# Patient Record
Sex: Male | Born: 1950 | Race: White | Hispanic: No | Marital: Married | Smoking: Never smoker
Health system: Southern US, Community
[De-identification: ages and names within clinical notes are randomized; demographics above are authoritative.]

## PROBLEM LIST (undated history)

## (undated) DIAGNOSIS — R011 Cardiac murmur, unspecified: Secondary | ICD-10-CM

## (undated) DIAGNOSIS — G4730 Sleep apnea, unspecified: Secondary | ICD-10-CM

## (undated) DIAGNOSIS — R0683 Snoring: Secondary | ICD-10-CM

## (undated) DIAGNOSIS — I7121 Aneurysm of the ascending aorta, without rupture: Secondary | ICD-10-CM

## (undated) DIAGNOSIS — E079 Disorder of thyroid, unspecified: Secondary | ICD-10-CM

## (undated) DIAGNOSIS — R001 Bradycardia, unspecified: Secondary | ICD-10-CM

## (undated) DIAGNOSIS — I10 Essential (primary) hypertension: Secondary | ICD-10-CM

## (undated) DIAGNOSIS — K219 Gastro-esophageal reflux disease without esophagitis: Secondary | ICD-10-CM

## (undated) DIAGNOSIS — I471 Supraventricular tachycardia, unspecified: Secondary | ICD-10-CM

## (undated) DIAGNOSIS — E039 Hypothyroidism, unspecified: Secondary | ICD-10-CM

## (undated) DIAGNOSIS — R931 Abnormal findings on diagnostic imaging of heart and coronary circulation: Secondary | ICD-10-CM

## (undated) DIAGNOSIS — M4800 Spinal stenosis, site unspecified: Secondary | ICD-10-CM

## (undated) DIAGNOSIS — R351 Nocturia: Secondary | ICD-10-CM

## (undated) DIAGNOSIS — N6310 Unspecified lump in the right breast, unspecified quadrant: Secondary | ICD-10-CM

## (undated) DIAGNOSIS — I444 Left anterior fascicular block: Secondary | ICD-10-CM

## (undated) HISTORY — DX: Essential (primary) hypertension: I10

## (undated) HISTORY — PX: BACK SURGERY: SHX140

## (undated) HISTORY — PX: CARPAL TUNNEL RELEASE: SHX101

## (undated) HISTORY — DX: Supraventricular tachycardia: I47.1

## (undated) HISTORY — DX: Aneurysm of the ascending aorta, without rupture: I71.21

## (undated) HISTORY — DX: Left anterior fascicular block: I44.4

## (undated) HISTORY — PX: TONSILLECTOMY: SUR1361

## (undated) HISTORY — DX: Gastro-esophageal reflux disease without esophagitis: K21.9

## (undated) HISTORY — DX: Abnormal findings on diagnostic imaging of heart and coronary circulation: R93.1

## (undated) HISTORY — DX: Supraventricular tachycardia, unspecified: I47.10

## (undated) HISTORY — PX: LASIK: SHX215

## (undated) HISTORY — DX: Disorder of thyroid, unspecified: E07.9

## (undated) HISTORY — DX: Cardiac murmur, unspecified: R01.1

## (undated) HISTORY — PX: UVULECTOMY: SHX2631

## (undated) HISTORY — PX: OTHER SURGICAL HISTORY: SHX169

## (undated) HISTORY — DX: Nocturia: R35.1

## (undated) HISTORY — PX: SEPTOPLASTY: SUR1290

## (undated) HISTORY — DX: Bradycardia, unspecified: R00.1

## (undated) HISTORY — DX: Spinal stenosis, site unspecified: M48.00

---

## 2022-02-19 ENCOUNTER — Encounter (INDEPENDENT_AMBULATORY_CARE_PROVIDER_SITE_OTHER): Payer: Self-pay

## 2022-02-19 ENCOUNTER — Telehealth: Payer: Self-pay | Admitting: *Deleted

## 2022-02-19 ENCOUNTER — Ambulatory Visit (INDEPENDENT_AMBULATORY_CARE_PROVIDER_SITE_OTHER): Payer: Medicare Other | Admitting: Cardiology

## 2022-02-19 ENCOUNTER — Other Ambulatory Visit: Payer: Self-pay

## 2022-02-19 VITALS — BP 134/78 | HR 77 | Ht 68.0 in | Wt 249.0 lb

## 2022-02-19 DIAGNOSIS — I471 Supraventricular tachycardia: Secondary | ICD-10-CM

## 2022-02-19 DIAGNOSIS — I77810 Thoracic aortic ectasia: Secondary | ICD-10-CM | POA: Diagnosis not present

## 2022-02-19 DIAGNOSIS — R001 Bradycardia, unspecified: Secondary | ICD-10-CM | POA: Diagnosis not present

## 2022-02-19 DIAGNOSIS — I10 Essential (primary) hypertension: Secondary | ICD-10-CM

## 2022-02-19 DIAGNOSIS — I444 Left anterior fascicular block: Secondary | ICD-10-CM | POA: Diagnosis not present

## 2022-02-19 NOTE — Patient Instructions (Addendum)
Medication Instructions:  ?Your physician recommends that you continue on your current medications as directed. Please refer to the Current Medication list given to you today. ? ?*If you need a refill on your cardiac medications before your next appointment, please call your pharmacy* ? ? ?Testing/Procedures: ?Your physician has requested that you have a calcium score CT scan. There is a $99 fee for the scan.  ? ? ?Your physician has recommended that you have a sleep study. This test records several body functions during sleep, including: brain activity, eye movement, oxygen and carbon dioxide blood levels, heart rate and rhythm, breathing rate and rhythm, the flow of air through your mouth and nose, snoring, body muscle movements, and chest and belly movement. ? ? ?Follow-Up: ?At [facility], you and your health needs are our priority.  As part of our continuing mission to provide you with exceptional heart care, we have created designated Provider Care Teams.  These Care Teams include your primary Cardiologist (physician) and Advanced Practice Providers (APPs -  Physician Assistants and Nurse Practitioners) who all work together to provide you with the care you need, when you need it. ? ? ?Your next appointment:   ?3 month(s) ? ?The format for your next appointment:   ?In Person ? ?Provider:   ?Fransico Him, MD ?

## 2022-02-19 NOTE — Addendum Note (Signed)
Addended by: Antonieta Iba on: 02/19/2022 01:59 PM ? ? Modules accepted: Orders ? ?

## 2022-02-19 NOTE — Progress Notes (Signed)
? ?Cardiology CONSULT Note   ? ?Date:  02/19/2022  ? ?ID:  Gregory Simpson, DOB [birth year], MRN [id number] ? ?PCP:  Clemmie Krill, FNP  ?Cardiologist:  Fransico Him, MD  ? ?Chief Complaint  ?Patient presents with  ? New Patient (Initial Visit)  ?  SVT, bradycardia and LAFB  ? ? ?History of Present Illness:  ?Gregory Simpson is a 71 y.o. male who is being seen today for the evaluation of asymptomatic left anterior fascicular block and a history of bradycardia at the request of Clemmie Krill, FNP. ? ?This is a 71 year old male with a history of GERD, hypertension, remote history of SVT.  He recently was seen by his primary physician recently and complained that he was having palpitations and felt like his current Cardiologist was not helping him.  He wore a Ziopatch which showed SVT, NCVT and tachy brady syndrome. 2D echo done 12/31/2021 showed normal LVF with EF 60-65% with mil AR.  He has a hx of bradycardia that has been a chronic problem.  EKG in Sept 22 showed sinus bradycardia with HR 46bpm and recently 48bpm. He has a hx of dilated ascending aorta at 4.3cm on Echo 12/31/2021.  Review of recent Ziopatch showed SVT as high as 160bpm.  There was no NSVT noted. Average heart rate was 59bpm and ranged from 37bpm while sleeping to 114bpm, occasional PVCs and atrial triplets.  ? ?He denies any chest pain or pressure,  PND, orthopnea, LE edema, dizziness (except when working on his back and gets too fast) or syncope. He has mild DOE related to obesity.  He is compliant with his meds and is tolerating meds with no SE. His wife says that his bradycardia is mainly at night.  He says that he will wake up breathing hard and his heart rate will be fast.  He has a hx of sleep study over 12 years ago and it was mild and was not placed on CPAP.  He denies any palpitations during the day.   ? ?Past Medical History:  ?Diagnosis Date  ? GERD (gastroesophageal reflux disease)   ? HTN (hypertension)   ? Left anterior hemiblock   ?  Murmur   ? Nocturia   ? Sinus bradycardia   ? Spinal stenosis   ? SVT (supraventricular tachycardia) ([place])   ? Thyroid disease   ? ? ?Past Surgical History:  ?Procedure Laterality Date  ? CARPAL TUNNEL RELEASE    ? INSERTION OF INTERSPINOUS/INTERLAMINAR PROCESS STABILIZATION/DISTRACTION DEVICE    ? LASIK Bilateral   ? SEPTOPLASTY    ? TONSILLECTOMY    ? UVULECTOMY    ? ? ?Current Medications: ?Current Meds  ?Medication Sig  ? amLODipine (NORVASC) 5 MG tablet Take 10 mg by mouth daily.  ? esomeprazole (NEXIUM) 20 MG packet Take 20 mg by mouth daily before breakfast.  ? hydrALAZINE (APRESOLINE) 25 MG tablet Take 25 mg by mouth 2 (two) times daily.  ? levothyroxine (SYNTHROID) 88 MCG tablet Take 88 mcg by mouth daily before breakfast.  ? losartan (COZAAR) 100 MG tablet Take 100 mg by mouth daily.  ? ? ?Allergies:   Patient has no known allergies.  ? ?Social History  ? ?Socioeconomic History  ? Marital status: Married  ?  Spouse name: Not on file  ? Number of children: Not on file  ? Years of education: Not on file  ? Highest education level: Not on file  ?Occupational History  ? Not on file  ?Tobacco Use  ?  Smoking status: Never  ? Smokeless tobacco: Never  ?Substance and Sexual Activity  ? Alcohol use: Yes  ?  Alcohol/week: 1.0 standard drink  ?  Types: 1 Shots of liquor per week  ? Drug use: Never  ? Sexual activity: Not on file  ?Other Topics Concern  ? Not on file  ?Social History Narrative  ? Not on file  ? ?Social Determinants of Health  ? ?Financial Resource Strain: Not on file  ?Food Insecurity: Not on file  ?Transportation Needs: Not on file  ?Physical Activity: Not on file  ?Stress: Not on file  ?Social Connections: Not on file  ?  ? ?Family History:  The patient's family history includes Dementia in his mother; Stroke in his father.  ? ?ROS:   ?Please see the history of present illness.    ?ROS All other systems reviewed and are negative. ? ?   ? View : No data to display.  ?  ?  ?  ? ? ? ? ? ?PHYSICAL EXAM:    ?VS:  BP 134/78   Pulse 77   Ht '5\' 8"'$  (1.727 m)   Wt 249 lb (112.9 kg)   SpO2 97%   BMI 37.86 kg/m?    ?GEN: Well nourished, well developed, in no acute distress  ?HEENT: normal  ?Neck: no JVD, carotid bruits, or masses ?Cardiac: RRR; no murmurs, rubs, or gallops,no edema.  Intact distal pulses bilaterally.  ?Respiratory:  clear to auscultation bilaterally, normal work of breathing ?GI: soft, nontender, nondistended, + BS ?MS: no deformity or atrophy  ?Skin: warm and dry, no rash ?Neuro:  Alert and Oriented x 3, Strength and sensation are intact ?Psych: euthymic mood, full affect ? ?Wt Readings from Last 3 Encounters:  ?02/19/22 249 lb (112.9 kg)  ?  ? ? ?Studies/Labs Reviewed:  ? ?EKG:  EKG is ordered today.  The ekg ordered today demonstrates NSR and LAFB ? ?Recent Labs: ?No results found for requested labs within last 8760 hours.  ? ?Lipid Panel ?No results found for: CHOL, TRIG, HDL, CHOLHDL, VLDL, LDLCALC, LDLDIRECT ? ?Additional studies/ records that were reviewed today include:  ?2D echo and Ziopatch ? ? ? ?ASSESSMENT:   ? ?1. SVT (supraventricular tachycardia) ([place])   ?2. LAFB (left anterior fascicular block)   ?3. Bradycardia   ?4. Ascending aorta dilatation ([facility])   ?5. Primary hypertension   ? ? ? ?PLAN:  ?In order of problems listed above: ? ?SVT ?-he is completely asymptomatic during the day ?-he has episodes of tachycardia occur at night and wake him up at night  ?-I suspect that his SVT is being triggered by OSA as we also wakes up gasping for breath ?-since he is not symptomatic during the day I do not think we need to treat this ?-get home sleep study to assess for OSA ? ?2.  Asymptomatic bradycardia ?-HRs have been 46-50bpm for several years ?-recent Ziopatch showed average HR 59bpm ? ?3.  LAFB ?-2D echo 12/2021 showed normal LVF  ? ?4.  Mildly dilated ascending aorta ?-50m by echo 12/2021 ? ?5.  HTN ?-BP controlled on exam today ?-continue prescription drug management with Hydralazine '25mg'$   BID, amlodipine '5mg'$  daily and Losartan '100mg'$  daily.  ? ?6.  Cardiac risk factors ?-he has HTN ?-recommend coronary Ca score to assess future cardiac risk ? ?Time Spent: ?20 minutes total time of encounter, including 15 minutes spent in face-to-face patient care on the date of this encounter. This time includes coordination of care  and counseling regarding above mentioned problem list. Remainder of non-face-to-face time involved reviewing chart documents/testing relevant to the patient encounter and documentation in the medical record. I have independently reviewed documentation from referring provider ? ?Medication Adjustments/Labs and Tests Ordered: ?Current medicines are reviewed at length with the patient today.  Concerns regarding medicines are outlined above.  Medication changes, Labs and Tests ordered today are listed in the Patient Instructions below. ? ?There are no Patient Instructions on file for this visit. ? ? ?Signed, ?Fransico Him, MD  ?02/19/2022 1:48 PM    ?[facility] ?[address], [place], [place]  [id number] ?Phone: [phone]; Fax: [phone]  ? ?

## 2022-02-19 NOTE — Telephone Encounter (Signed)
Set up date 02/19/22 ?

## 2022-02-19 NOTE — Telephone Encounter (Signed)
Pt was seen in the office today with Dr. Heron Nay who ordered an [facility] sleep study. I have gone over the instructions on how to use the device. Have uploaded the application onto the pt's phone. Pt is aware to not open the box until we call them with the PIN#.  ?

## 2022-02-19 NOTE — Addendum Note (Signed)
Addended by: Antonieta Iba on: 02/19/2022 02:04 PM ? ? Modules accepted: Orders ? ?

## 2022-02-22 ENCOUNTER — Encounter (INDEPENDENT_AMBULATORY_CARE_PROVIDER_SITE_OTHER): Payer: Medicare Other | Admitting: Cardiology

## 2022-02-22 DIAGNOSIS — G4733 Obstructive sleep apnea (adult) (pediatric): Secondary | ICD-10-CM | POA: Diagnosis not present

## 2022-02-22 NOTE — Telephone Encounter (Signed)
Called and made the patient aware that he may proceed with the [facility] Sleep Study. PIN # provided to the patient. Patient made aware that he will be contacted after the test has been read with the results and any recommendations. Patient verbalized understanding and thanked me for the call.  ? ? ?Pt will do sleep study this week  ?

## 2022-02-22 NOTE — Telephone Encounter (Signed)
NO PA REQUIRED

## 2022-02-25 ENCOUNTER — Ambulatory Visit: Payer: Medicare Other

## 2022-02-25 DIAGNOSIS — I471 Supraventricular tachycardia: Secondary | ICD-10-CM

## 2022-02-25 DIAGNOSIS — R001 Bradycardia, unspecified: Secondary | ICD-10-CM

## 2022-02-25 DIAGNOSIS — I10 Essential (primary) hypertension: Secondary | ICD-10-CM

## 2022-02-25 DIAGNOSIS — I444 Left anterior fascicular block: Secondary | ICD-10-CM

## 2022-02-25 DIAGNOSIS — I77810 Thoracic aortic ectasia: Secondary | ICD-10-CM

## 2022-02-25 NOTE — Procedures (Signed)
? ?  Sleep Study Report ?Patient Information ?Study Date: 02/22/22 ?Patient Name: Gregory Simpson ?Patient ID: [id number] ?Birth Date: 15-Apr-2051 ?Age: 71 ?Gender: Male ?BMI: 37.8 (W=249 lb, H=5' 8'') ?Neck Circ.: 18 '' ?Referring Physician: Fransico Him, MD ? ?TEST DESCRIPTION: Home sleep apnea testing was completed using the WatchPat, a Type 1 device, utilizing  ?peripheral arterial tonometry (PAT), chest movement, actigraphy, pulse oximetry, pulse rate, body position and snore.  ?AHI was calculated with apnea and hypopnea using valid sleep time as the denominator. RDI includes apneas,  ?hypopneas, and RERAs. The data acquired and the scoring of sleep and all associated events were performed in  ?accordance with the recommended standards and specifications as outlined in the AASM Manual for the Scoring of  ?Sleep and Associated Events 2.2.0 (2015). ? ?FINDINGS: ?1. Moderate Obstructive Sleep Apnea with AHI 25.8/hr.  ?2. No significant Central Sleep Apnea with pAHIc 4.4/hr. ?3. Oxygen desaturations as low as 78%. ?4. Moderate snoring was present. O2 sats were < 88% for 2.1 min. ?5. Total sleep time was 7 hrs and 5 min. ?6. 11.4% of total sleep time was spent in REM sleep.  ?7. Prolonged sleep onset latency at 29 min ?8. Shortened REM sleep onset latency at 25 min.  ?9. Total awakenings were 7.  ? ?DIAGNOSIS:  ?Moderate Obstructive Sleep Apnea (G47.33) ? ?RECOMMENDATIONS: ?1. Clinical correlation of these findings is necessary. The decision to treat obstructive sleep apnea (OSA) is usually  ?based on the presence of apnea symptoms or the presence of associated medical conditions such as Hypertension,  ?Congestive Heart Failure, Atrial Fibrillation or Obesity. The most common symptoms of OSA are snoring, gasping for  ?breath while sleeping, daytime sleepiness and fatigue.  ? ?2. Initiating apnea therapy is recommended given the presence of symptoms and/or associated conditions.  ?Recommend proceeding with one of the  following: ? ? a. Auto-CPAP therapy with a pressure range of 5-20cm H2O. ? ? b. An oral appliance (OA) that can be obtained from certain dentists with expertise in sleep medicine. These are  ?primarily of use in non-obese patients with mild and moderate disease. ? ? c. An ENT consultation which may be useful to look for specific causes of obstruction and possible treatment  ?options. ? ? d. If patient is intolerant to PAP therapy, consider referral to ENT for evaluation for hypoglossal nerve stimulator.  ? ?3. Close follow-up is necessary to ensure success with CPAP or oral appliance therapy for maximum benefit . ? ?4. A follow-up oximetry study on CPAP is recommended to assess the adequacy of therapy and determine the need  ?for supplemental oxygen or the potential need for Bi-level therapy. An arterial blood gas to determine the adequacy of  ?baseline ventilation and oxygenation should also be considered. ? ?5. Healthy sleep recommendations include: adequate nightly sleep (normal 7-9 hrs/night), avoidance of caffeine after  ?noon and alcohol near bedtime, and maintaining a sleep environment that is cool, dark and quiet. ? ?6. Weight loss for overweight patients is recommended. Even modest amounts of weight loss can significantly  ?improve the severity of sleep apnea. ? ?7. Snoring recommendations include: weight loss where appropriate, side sleeping, and avoidance of alcohol before  ?bed. ? ?8. Operation of motor vehicle should not be performed when sleepy. ? ?Signature: ?Electronically Signed: 02/25/22 ?Fransico Him, MD; [facility]; [place], [place] Board of  Sleep Medicine ? ?

## 2022-03-09 ENCOUNTER — Ambulatory Visit
Admission: RE | Admit: 2022-03-09 | Discharge: 2022-03-09 | Disposition: A | Discharge status: Home / Self Care | Payer: Self-pay | Source: Ambulatory Visit | Attending: Cardiology | Admitting: Cardiology

## 2022-03-09 ENCOUNTER — Encounter: Payer: Self-pay | Admitting: Cardiology

## 2022-03-09 DIAGNOSIS — I10 Essential (primary) hypertension: Secondary | ICD-10-CM

## 2022-03-09 DIAGNOSIS — I471 Supraventricular tachycardia: Secondary | ICD-10-CM

## 2022-03-09 DIAGNOSIS — R931 Abnormal findings on diagnostic imaging of heart and coronary circulation: Secondary | ICD-10-CM | POA: Insufficient documentation

## 2022-03-09 DIAGNOSIS — I77810 Thoracic aortic ectasia: Secondary | ICD-10-CM

## 2022-03-09 DIAGNOSIS — I444 Left anterior fascicular block: Secondary | ICD-10-CM

## 2022-03-09 DIAGNOSIS — R001 Bradycardia, unspecified: Secondary | ICD-10-CM

## 2022-03-10 ENCOUNTER — Telehealth: Payer: Self-pay | Admitting: *Deleted

## 2022-03-10 ENCOUNTER — Telehealth: Payer: Self-pay | Admitting: Cardiology

## 2022-03-10 DIAGNOSIS — J3489 Other specified disorders of nose and nasal sinuses: Secondary | ICD-10-CM

## 2022-03-10 DIAGNOSIS — I77810 Thoracic aortic ectasia: Secondary | ICD-10-CM

## 2022-03-10 NOTE — Telephone Encounter (Signed)
The patient has been notified of the result and verbalized understanding.  All questions (if any) were answered. ?Antonieta Iba, RN 03/10/2022 5:21 PM  ?Right breast ultrasound  and CTA have been ordered.  ?

## 2022-03-10 NOTE — Telephone Encounter (Signed)
-----   Message from Sueanne Margarita, MD sent at 03/10/2022  3:22 PM EDT ----- ?Non cardiac portion of coronary Ca score showed multiple enlarged lymph nodes in the chest and anterior right chest subcutaneous tissues of unclear etiology.  This can be assessed at time of Chest CTA with contrast - please do ASAP.  Also get a dedicated right breast US - will need to be done at breast center.  - please forward to PCP ?

## 2022-03-10 NOTE — Telephone Encounter (Signed)
The patient has been notified of the result and verbalized understanding.  All questions (if any) were answered. ?Gregory Simpson, [name] 03/10/2022 5:40 PM   ? ?Patient can not tolerate cpap because he can't have any air blowing up his right nostril because it causes him severe pain. Patient states when he lies down he gets fluid build up in his nostrils and he can't breathe so a cpap is not an option.  ?The patient says he communicated this to you in his office visit and you advised him there are other alternatives. ?

## 2022-03-10 NOTE — Telephone Encounter (Signed)
Patient's wife is returning call to discuss CT results. Please return call to [phone]. ?

## 2022-03-10 NOTE — Telephone Encounter (Signed)
-----   Message from Precious Gilding, RN sent at 03/10/2022  7:55 AM EDT ----- ? ?----- Message ----- ?From: Sueanne Margarita, MD ?Sent: 03/09/2022   4:39 PM EDT ?To: Cv Div Ch St Triage ? ?Patient has coronary calcium score 60 which is 33rd percentile for age, gender and race matched controls.  He also has a severe ascending aortic aneurysm measuring 55 mm on this noncontrasted study.  Please order a gated chest CTA with contrast for further evaluation of his aneurysm. ? ?

## 2022-03-10 NOTE — Telephone Encounter (Signed)
-----   Message from Lauralee Evener, [place] sent at 02/25/2022  2:56 PM EDT ----- ? ?----- Message ----- ?From: Sueanne Margarita, MD ?Sent: 02/25/2022   2:32 PM EDT ?To: Cv Div Sleep Studies ? ?Please let patient know that they have sleep apnea.  Recommend therapeutic CPAP titration for treatment of patient's sleep disordered breathing.  If unable to perform an in lab titration then initiate ResMed auto CPAP from 4 to 15cm H2O with heated humidity and mask of choice and overnight pulse ox on CPAP.    ? ?

## 2022-03-11 ENCOUNTER — Other Ambulatory Visit: Payer: Self-pay | Admitting: Cardiology

## 2022-03-11 DIAGNOSIS — I77810 Thoracic aortic ectasia: Secondary | ICD-10-CM

## 2022-03-11 NOTE — Addendum Note (Signed)
Addended by: Antonieta Iba on: 03/11/2022 01:16 PM ? ? Modules accepted: Orders ? ?

## 2022-03-17 LAB — BASIC METABOLIC PANEL
BUN/Creatinine Ratio: 20 (ref 10–24)
BUN: 20 mg/dL (ref 8–27)
CO2: 25 mmol/L (ref 20–29)
Calcium: 9.7 mg/dL (ref 8.6–10.2)
Chloride: 107 mmol/L — ABNORMAL HIGH (ref 96–106)
Creatinine, Ser: 0.98 mg/dL (ref 0.76–1.27)
Glucose: 97 mg/dL (ref 70–99)
Potassium: 4.7 mmol/L (ref 3.5–5.2)
Sodium: 143 mmol/L (ref 134–144)
eGFR: 83 mL/min/{1.73_m2} (ref >59)

## 2022-03-18 NOTE — Telephone Encounter (Signed)
Referral has been placed. 

## 2022-03-18 NOTE — Addendum Note (Signed)
Addended by: Antonieta Iba on: 03/18/2022 02:17 PM ? ? Modules accepted: Orders ? ?

## 2022-03-23 ENCOUNTER — Telehealth: Payer: Self-pay

## 2022-03-23 ENCOUNTER — Encounter: Payer: Self-pay | Admitting: Cardiology

## 2022-03-23 ENCOUNTER — Ambulatory Visit (HOSPITAL_COMMUNITY)
Admission: RE | Admit: 2022-03-23 | Discharge: 2022-03-23 | Disposition: A | Discharge status: Home / Self Care | Payer: Medicare Other | Source: Ambulatory Visit | Attending: Cardiology | Admitting: Cardiology

## 2022-03-23 DIAGNOSIS — I77810 Thoracic aortic ectasia: Secondary | ICD-10-CM | POA: Insufficient documentation

## 2022-03-23 MED ORDER — IOHEXOL 350 MG/ML SOLN
100.0000 mL | Freq: Once | INTRAVENOUS | Status: AC | PRN
  Administered 2022-03-23: 100 mL via INTRAVENOUS

## 2022-03-23 NOTE — Telephone Encounter (Signed)
The patient has been notified of the result and verbalized understanding.  All questions (if any) were answered. ?Antonieta Iba, RN 03/23/2022 3:56 PM  ?Repeat chest CTA scan has been ordered.  ?

## 2022-03-23 NOTE — Telephone Encounter (Signed)
-----   Message from Sueanne Margarita, MD sent at 03/23/2022  3:10 PM EDT ----- ?Repeat chest CTA in 6 months ?

## 2022-03-24 ENCOUNTER — Telehealth: Payer: Self-pay | Admitting: Cardiology

## 2022-03-24 NOTE — Telephone Encounter (Signed)
Need to discuss the referral to ENT.    Per wife spoke with PA @ [facility] ENT when she went in for her appointment.   She stated she discuss with the PA what was going on with her husband.  He told her that he wouldn't  be a candidate for the Inspire due to his BMI.  Dr. Redmond Baseman office has not called the patient to schedule from our referral. ?

## 2022-03-25 ENCOUNTER — Telehealth: Payer: Self-pay | Admitting: Cardiology

## 2022-03-25 DIAGNOSIS — I77810 Thoracic aortic ectasia: Secondary | ICD-10-CM

## 2022-03-25 NOTE — Telephone Encounter (Signed)
Patient's wife calling back to follow up. 

## 2022-03-25 NOTE — Telephone Encounter (Signed)
Spoke to wife (husband gave verbal permission). ?Informed that she has not gotten call back yet as MD & RN are not in  the office today.  Asked if there was something pressing since she called back and she responded with a long "no". ? ?Informed that Carly would follow up by next week on this matter. ?Aware that nurse should be able to change order, release it, and them have it completed at [facility] there in [place]. ?Informed not sure if CT can be done at [facility] but nurse will let her know when she returns call. ?Wife agreeable with plan. ? ?

## 2022-03-25 NOTE — Telephone Encounter (Signed)
Wife of patient called. Wife had concerns regarding some of the patient's upcoming appointments. ? ?She wanted to make sure that the lab orders for his BMP are public so that she can get them done at a [facility] facility in [place] instead of driving to [place]. ? ?She also wanted to know if the CT that is scheduled for 09/07/22 could be done at [facility] to save a trip to [place].  ? ?Please follow up with the patient's Wife   ?

## 2022-03-26 NOTE — Addendum Note (Signed)
Addended by: Antonieta Iba on: 03/26/2022 04:42 PM ? ? Modules accepted: Orders ? ?

## 2022-03-26 NOTE — Telephone Encounter (Signed)
Spoke with the patient and his wife and advised that he can have lab work done at [facility]. Orders have been changed. He can also have his CT scan done at [facility]. Appointment in [place] has been cancelled. Orders have been put in for CT scan to be done at [facility]. They are aware that the schedulers will reach out to schedule.  ?

## 2022-04-06 ENCOUNTER — Other Ambulatory Visit: Payer: Self-pay | Admitting: Cardiology

## 2022-04-06 ENCOUNTER — Ambulatory Visit
Admission: RE | Admit: 2022-04-06 | Discharge: 2022-04-06 | Disposition: A | Discharge status: Home / Self Care | Payer: Medicare Other | Source: Ambulatory Visit | Attending: Cardiology | Admitting: Cardiology

## 2022-04-06 DIAGNOSIS — I77810 Thoracic aortic ectasia: Secondary | ICD-10-CM

## 2022-04-06 DIAGNOSIS — N6310 Unspecified lump in the right breast, unspecified quadrant: Secondary | ICD-10-CM

## 2022-04-09 ENCOUNTER — Ambulatory Visit
Admission: RE | Admit: 2022-04-09 | Discharge: 2022-04-09 | Disposition: A | Discharge status: Home / Self Care | Payer: Medicare Other | Source: Ambulatory Visit | Attending: Cardiology | Admitting: Cardiology

## 2022-04-09 DIAGNOSIS — N6310 Unspecified lump in the right breast, unspecified quadrant: Secondary | ICD-10-CM

## 2022-04-09 HISTORY — PX: BREAST BIOPSY: SHX20

## 2022-05-13 ENCOUNTER — Other Ambulatory Visit: Payer: Self-pay | Admitting: General Surgery

## 2022-05-13 DIAGNOSIS — N6315 Unspecified lump in the right breast, overlapping quadrants: Secondary | ICD-10-CM

## 2022-06-09 ENCOUNTER — Encounter: Payer: Self-pay | Admitting: Cardiology

## 2022-06-09 ENCOUNTER — Ambulatory Visit (INDEPENDENT_AMBULATORY_CARE_PROVIDER_SITE_OTHER): Payer: Medicare Other | Admitting: Cardiology

## 2022-06-09 VITALS — BP 136/80 | HR 51 | Ht 68.0 in | Wt 236.0 lb

## 2022-06-09 DIAGNOSIS — I444 Left anterior fascicular block: Secondary | ICD-10-CM

## 2022-06-09 DIAGNOSIS — I471 Supraventricular tachycardia: Secondary | ICD-10-CM | POA: Diagnosis not present

## 2022-06-09 DIAGNOSIS — R001 Bradycardia, unspecified: Secondary | ICD-10-CM | POA: Diagnosis not present

## 2022-06-09 DIAGNOSIS — I10 Essential (primary) hypertension: Secondary | ICD-10-CM

## 2022-06-09 DIAGNOSIS — G4733 Obstructive sleep apnea (adult) (pediatric): Secondary | ICD-10-CM

## 2022-06-09 DIAGNOSIS — I77810 Thoracic aortic ectasia: Secondary | ICD-10-CM

## 2022-06-09 DIAGNOSIS — R931 Abnormal findings on diagnostic imaging of heart and coronary circulation: Secondary | ICD-10-CM

## 2022-06-09 LAB — ALT: ALT: 22 IU/L (ref 0–44)

## 2022-06-09 LAB — LIPID PANEL
Chol/HDL Ratio: 5.7 ratio — ABNORMAL HIGH (ref 0.0–5.0)
Cholesterol, Total: 132 mg/dL (ref 100–199)
HDL: 23 mg/dL — ABNORMAL LOW (ref >39)
LDL Chol Calc (NIH): 66 mg/dL (ref 0–99)
Triglycerides: 262 mg/dL — ABNORMAL HIGH (ref 0–149)
VLDL Cholesterol Cal: 43 mg/dL — ABNORMAL HIGH (ref 5–40)

## 2022-06-09 NOTE — Patient Instructions (Signed)
Medication Instructions:  Your physician recommends that you continue on your current medications as directed. Please refer to the Current Medication list given to you today.  *If you need a refill on your cardiac medications before your next appointment, please call your pharmacy*  Lab Work: TODAY: FLP and ALT If you have labs (blood work) drawn today and your tests are completely normal, you will receive your results only by: [place] (if you have MyChart) OR A paper copy in the mail If you have any lab test that is abnormal or we need to change your treatment, we will call you to review the results.   Follow-Up: At [facility], you and your health needs are our priority.  As part of our continuing mission to provide you with exceptional heart care, we have created designated Provider Care Teams.  These Care Teams include your primary Cardiologist (physician) and Advanced Practice Providers (APPs -  Physician Assistants and Nurse Practitioners) who all work together to provide you with the care you need, when you need it.   Your next appointment:   1 year(s)  The format for your next appointment:   In Person  Provider:   Fransico Him, MD    Other Instructions You have been referred to Dr. Wilburn Cornelia with ENT  Important Information About [place]

## 2022-06-09 NOTE — Addendum Note (Signed)
Addended by: Antonieta Iba on: 06/09/2022 08:55 AM   Modules accepted: Orders

## 2022-06-09 NOTE — Progress Notes (Signed)
Cardiology CONSULT Note    Date:  06/09/2022   ID:  Gregory Simpson, DOB [birth year], MRN [id number]  PCP:  Clemmie Krill, FNP  Cardiologist:  Fransico Him, MD   Chief Complaint  Patient presents with   Follow-up    SVT, bradycardia, OSA, HTN    History of Present Illness:   This is a 71 year old male with a history of GERD, hypertension, remote history of SVT.  He recently was seen by his primary physician recently and complained that he was having palpitations and felt like his current Cardiologist was not helping him.  He wore a Ziopatch which showed SVT, NSVT and tachy brady syndrome. 2D echo done 12/31/2021 showed normal LVF with EF 60-65% with mil AR.  He has a hx of bradycardia that has been a chronic problem.  EKG in Sept 22 showed sinus bradycardia with HR 46bpm and recently 48bpm. He has a hx of dilated ascending aorta at 4.3cm on Echo 12/31/2021.  Review of recent Ziopatch showed SVT as high as 160bpm.  There was no NSVT noted. Average heart rate was 59bpm and ranged from 37bpm while sleeping to 114bpm, occasional PVCs and atrial triplets.  He recently underwent sleep study due to nocturnal palpitations and was found to have moderate OSA with an AHI of 25.8/hr and was started on CPAP therapy. Marland Kitchen He recently called in stating that he could not tolerate the CPAP due to air blowing up his right nostril giving him pain and he nasal congestion and cannot breathe.  He was referred to ENT as the patient wanted to be considered for the INspire device but BMI was too high and currently not a candidate and recommended that he lose 15lbs.  He was dx with nasal turbinate hypertrophy and recommended nasal steroid spray daily.   He is here today for followup and is doing well. He has been using the Flonase which is helping him.  His wife has used a pulse ox at times when he is sleeping and pulse ox has been good.  He has lost 9lbs since he saw Dr. Redmond Baseman.     He denies any chest pain or pressure,  SOB, DOE, PND, orthopnea, LE edema, dizziness, palpitations or syncope. He is compliant with his meds and is tolerating meds with no SE.     Past Medical History:  Diagnosis Date   Agatston coronary artery calcium score less than 100    Coronary calcium score 35 which is 33rd percentile age, gender, race matched controls   Ascending aortic aneurysm ([place])    4.6cm on Chest CTA 02/2022   GERD (gastroesophageal reflux disease)    HTN (hypertension)    Left anterior hemiblock    Murmur    Nocturia    Sinus bradycardia    Spinal stenosis    SVT (supraventricular tachycardia) (HCC)    Thyroid disease     Past Surgical History:  Procedure Laterality Date   BREAST BIOPSY Right 04/09/2022   CARPAL TUNNEL RELEASE     INSERTION OF INTERSPINOUS/INTERLAMINAR PROCESS STABILIZATION/DISTRACTION DEVICE     LASIK Bilateral    SEPTOPLASTY     TONSILLECTOMY     UVULECTOMY      Current Medications: Current Meds  Medication Sig   amLODipine (NORVASC) 5 MG tablet Take 10 mg by mouth daily.   esomeprazole (NEXIUM) 20 MG packet Take 20 mg by mouth daily before breakfast.   hydrALAZINE (APRESOLINE) 25 MG tablet Take 25 mg by mouth 2 (two)  times daily.   levothyroxine (SYNTHROID) 88 MCG tablet Take 88 mcg by mouth daily before breakfast.   losartan (COZAAR) 100 MG tablet Take 100 mg by mouth daily.    Allergies:   Patient has no known allergies.   Social History   Socioeconomic History   Marital status: Married    Spouse name: Not on file   Number of children: Not on file   Years of education: Not on file   Highest education level: Not on file  Occupational History   Not on file  Tobacco Use   Smoking status: Never   Smokeless tobacco: Never  Substance and Sexual Activity   Alcohol use: Not Currently   Drug use: Never   Sexual activity: Not on file  Other Topics Concern   Not on file  Social History Narrative   Not on file   Social Determinants of Health   Financial Resource  Strain: Not on file  Food Insecurity: Not on file  Transportation Needs: Not on file  Physical Activity: Not on file  Stress: Not on file  Social Connections: Not on file     Family History:  The patient's family history includes Dementia in his mother; Stroke in his father.   ROS:   Please see the history of present illness.    ROS All other systems reviewed and are negative.      No data to display             PHYSICAL EXAM:   VS:  BP 136/80   Pulse (!) 51   Ht '5\' 8"'$  (1.727 m)   Wt 236 lb (107 kg)   SpO2 96%   BMI 35.88 kg/m    GEN: Well nourished, well developed, in no acute distress  HEENT: normal  Neck: no JVD, carotid bruits, or masses Cardiac: RRR; no murmurs, rubs, or gallops,no edema.  Intact distal pulses bilaterally.  Respiratory:  clear to auscultation bilaterally, normal work of breathing GI: soft, nontender, nondistended, + BS MS: no deformity or atrophy  Skin: warm and dry, no rash Neuro:  Alert and Oriented x 3, Strength and sensation are intact Psych: euthymic mood, full affect  Wt Readings from Last 3 Encounters:  06/09/22 236 lb (107 kg)  02/19/22 249 lb (112.9 kg)      Studies/Labs Reviewed:   EKG:  EKG is not ordered today.    Recent Labs: 03/16/2022: BUN 20; Creatinine, Ser 0.98; Potassium 4.7; Sodium 143   Lipid Panel No results found for: "CHOL", "TRIG", "HDL", "CHOLHDL", "VLDL", "LDLCALC", "LDLDIRECT"  Additional studies/ records that were reviewed today include:  2D echo and Ziopatch    ASSESSMENT:    1. SVT (supraventricular tachycardia) ([place])   2. Bradycardia   3. LAFB (left anterior fascicular block)   4. Ascending aorta dilatation (HCC)   5. Primary hypertension   6. Agatston coronary artery calcium score less than 100   7. OSA (obstructive sleep apnea)      PLAN:  In order of problems listed above:  SVT -he remains in NSR with minimal palpitations -likely being driven by OSA as they occur at night.  2.   Asymptomatic bradycardia -recent Ziopatch showed average HR 59bpm  3.  LAFB -2D echo 12/2021 showed normal LVF   4.  Mildly dilated ascending aorta -74m by echo 12/2021  5.  HTN -BP controlled on exam today -continue prescription drug management with Hydralazine '25mg'$  BID, Amlodipine '5mg'$  daily and Losartan '100mg'$  daily with PRN refills.  6.  Coronary artery calcifications -Coronary Ca score was 60  7.  OSA - -intolerant to CPAP therapy -referred to ENT and has turbinate hypertrophy and recommended nasal steroid spry -currently not a candidate for Inpsire due to BMI >> recommended that he lose 15lbs and then followup with ENT and also consider oral device -he has seen Dr. Redmond Baseman but would like to change to Dr. Wilburn Cornelia so I will refer for Inspire device  Time Spent: 20 minutes total time of encounter, including 15 minutes spent in face-to-face patient care on the date of this encounter. This time includes coordination of care and counseling regarding above mentioned problem list. Remainder of non-face-to-face time involved reviewing chart documents/testing relevant to the patient encounter and documentation in the medical record. I have independently reviewed documentation from referring provider  Medication Adjustments/Labs and Tests Ordered: Current medicines are reviewed at length with the patient today.  Concerns regarding medicines are outlined above.  Medication changes, Labs and Tests ordered today are listed in the Patient Instructions below.  There are no Patient Instructions on file for this visit.   Signed, Fransico Him, MD  06/09/2022 8:03 AM    [address], [place], [place]  [address] Phone: [phone]; Fax: [phone]

## 2022-06-10 ENCOUNTER — Telehealth: Payer: Self-pay

## 2022-06-10 DIAGNOSIS — I77810 Thoracic aortic ectasia: Secondary | ICD-10-CM

## 2022-06-10 DIAGNOSIS — I10 Essential (primary) hypertension: Secondary | ICD-10-CM

## 2022-06-10 DIAGNOSIS — R931 Abnormal findings on diagnostic imaging of heart and coronary circulation: Secondary | ICD-10-CM

## 2022-06-10 NOTE — Telephone Encounter (Signed)
Spoke with the patient and his wife and he will repeat labs at [facility] in [place].

## 2022-06-10 NOTE — Telephone Encounter (Signed)
-----   Message from Sueanne Margarita, MD sent at 06/10/2022 11:01 AM EDT ----- Have him come in and repeat a fasting lipid panel ----- Message ----- From: Antonieta Iba, RN Sent: 06/10/2022   9:54 AM EDT To: Sueanne Margarita, MD  Spoke with the patient who reports that prior to his appointment he had some cottage cheese.

## 2022-06-15 ENCOUNTER — Telehealth: Payer: Self-pay | Admitting: Cardiology

## 2022-06-15 ENCOUNTER — Encounter: Payer: Self-pay | Admitting: Cardiology

## 2022-06-15 DIAGNOSIS — Z79899 Other long term (current) drug therapy: Secondary | ICD-10-CM

## 2022-06-15 DIAGNOSIS — R931 Abnormal findings on diagnostic imaging of heart and coronary circulation: Secondary | ICD-10-CM

## 2022-06-15 DIAGNOSIS — I77810 Thoracic aortic ectasia: Secondary | ICD-10-CM

## 2022-06-15 LAB — LIPID PANEL
Chol/HDL Ratio: 4.8 ratio (ref 0.0–5.0)
Cholesterol, Total: 129 mg/dL (ref 100–199)
HDL: 27 mg/dL — ABNORMAL LOW (ref >39)
LDL Chol Calc (NIH): 73 mg/dL (ref 0–99)
Triglycerides: 166 mg/dL — ABNORMAL HIGH (ref 0–149)
VLDL Cholesterol Cal: 29 mg/dL (ref 5–40)

## 2022-06-15 MED ORDER — ROSUVASTATIN CALCIUM 5 MG PO TABS: 5.0000 mg | ORAL_TABLET | Freq: Every day | ORAL | 3 refills | Status: DC

## 2022-06-15 NOTE — Telephone Encounter (Signed)
Pt's wife is returning call and is requesting call back. Pt's wife states that she would like to be called back today due to them going out of town tomorrow.

## 2022-06-15 NOTE — Telephone Encounter (Signed)
-----   Message from Sueanne Margarita, MD sent at 06/15/2022  8:52 AM EDT ----- LDL goal is less than 70 due to coronary calcifications.  Please start Crestor 5 mg daily and repeat FLP and ALT in 6 weeks

## 2022-06-15 NOTE — Telephone Encounter (Signed)
The patient has been notified of the result and verbalized understanding.  All questions (if any) were answered. Antonieta Iba, RN 06/15/2022 4:03 PM    Spoke with the patient's wife who states that she would like for all of his future labs to be sent to [facility] to be drawn. Their fax number is [phone]

## 2022-06-15 NOTE — Telephone Encounter (Signed)
error 

## 2022-06-30 ENCOUNTER — Telehealth: Payer: Self-pay | Admitting: Cardiology

## 2022-06-30 ENCOUNTER — Other Ambulatory Visit: Payer: Self-pay

## 2022-06-30 ENCOUNTER — Encounter (HOSPITAL_BASED_OUTPATIENT_CLINIC_OR_DEPARTMENT_OTHER): Payer: Self-pay | Admitting: General Surgery

## 2022-06-30 NOTE — Telephone Encounter (Signed)
Pt wife is requesting that a lab order for BMP be sent over to [facility] to fax below Fax : [phone] which is for a procedure in October

## 2022-06-30 NOTE — Telephone Encounter (Signed)
Lab orders have been faxed

## 2022-07-05 ENCOUNTER — Ambulatory Visit
Admission: RE | Admit: 2022-07-05 | Discharge: 2022-07-05 | Disposition: A | Discharge status: Home / Self Care | Payer: Medicare Other | Source: Ambulatory Visit | Attending: General Surgery | Admitting: General Surgery

## 2022-07-05 DIAGNOSIS — N6315 Unspecified lump in the right breast, overlapping quadrants: Secondary | ICD-10-CM

## 2022-07-05 NOTE — Progress Notes (Signed)
Pt given CHG soap and Ensure presurgery drink with instruction to finish DOS by 0600. Written instruction provided for both and pt and wife verbalized understanding.     Enhanced Recovery after Surgery for Orthopedics Enhanced Recovery after Surgery is a protocol used to improve the stress on your body and your recovery after surgery.  Patient Instructions  The night before surgery:  No food after midnight. ONLY clear liquids after midnight  The day of surgery (if you do NOT have diabetes):  Drink ONE (1) Pre-Surgery Clear Ensure as directed.   This drink was given to you during your hospital  pre-op appointment visit. The pre-op nurse will instruct you on the time to drink the  Pre-Surgery Ensure depending on your surgery time. Finish the drink at the designated time by the pre-op nurse.  Nothing else to drink after completing the  Pre-Surgery Clear Ensure.  The day of surgery (if you have diabetes): Drink ONE (1) Gatorade 2 (G2) as directed. This drink was given to you during your hospital  pre-op appointment visit.  The pre-op nurse will instruct you on the time to drink the   Gatorade 2 (G2) depending on your surgery time. Color of the Gatorade may vary. Red is not allowed. Nothing else to drink after completing the  Gatorade 2 (G2).         If you have questions, please contact your surgeon's office.

## 2022-07-07 NOTE — Anesthesia Preprocedure Evaluation (Signed)
Anesthesia Evaluation  Patient identified by MRN, date of birth, ID band Patient awake    Reviewed: Allergy & Precautions, NPO status , Patient's Chart, lab work & pertinent test results  History of Anesthesia Complications Negative for: history of anesthetic complications  Airway Mallampati: II  TM Distance: >3 FB Neck ROM: Full    Dental no notable dental hx. (+) Dental Advisory Given   Pulmonary sleep apnea ,    Pulmonary exam normal        Cardiovascular hypertension, Pt. on medications Normal cardiovascular exam+ dysrhythmias Supra Ventricular Tachycardia + Valvular Problems/Murmurs AI   Echo: normal EF, Mild AR   Neuro/Psych negative neurological ROS     GI/Hepatic Neg liver ROS, GERD  Medicated,  Endo/Other  Hypothyroidism   Renal/GU negative Renal ROS     Musculoskeletal negative musculoskeletal ROS (+)   Abdominal   Peds  Hematology negative hematology ROS (+)   Anesthesia Other Findings   Reproductive/Obstetrics                            Anesthesia Physical Anesthesia Plan  ASA: 3  Anesthesia Plan: General   Post-op Pain Management: Celebrex PO (pre-op)* and Tylenol PO (pre-op)*   Induction: Intravenous  PONV Risk Score and Plan: 2 and Ondansetron, Dexamethasone and TIVA  Airway Management Planned: LMA  Additional Equipment:   Intra-op Plan:   Post-operative Plan: Extubation in OR  Informed Consent: I have reviewed the patients History and Physical, chart, labs and discussed the procedure including the risks, benefits and alternatives for the proposed anesthesia with the patient or authorized representative who has indicated his/her understanding and acceptance.     Dental advisory given  Plan Discussed with: Anesthesiologist and CRNA  Anesthesia Plan Comments:       Anesthesia Quick Evaluation

## 2022-07-08 ENCOUNTER — Encounter (HOSPITAL_BASED_OUTPATIENT_CLINIC_OR_DEPARTMENT_OTHER)
Admission: RE | Disposition: A | Discharge status: Home / Self Care | Payer: Self-pay | Source: Home / Self Care | Attending: General Surgery

## 2022-07-08 ENCOUNTER — Ambulatory Visit (HOSPITAL_BASED_OUTPATIENT_CLINIC_OR_DEPARTMENT_OTHER): Payer: Medicare Other | Admitting: Anesthesiology

## 2022-07-08 ENCOUNTER — Other Ambulatory Visit: Payer: Self-pay

## 2022-07-08 ENCOUNTER — Ambulatory Visit (HOSPITAL_BASED_OUTPATIENT_CLINIC_OR_DEPARTMENT_OTHER)
Admission: RE | Admit: 2022-07-08 | Discharge: 2022-07-08 | Disposition: A | Discharge status: Home / Self Care | Payer: Medicare Other | Attending: General Surgery | Admitting: General Surgery

## 2022-07-08 ENCOUNTER — Ambulatory Visit
Admission: RE | Admit: 2022-07-08 | Discharge: 2022-07-08 | Disposition: A | Discharge status: Home / Self Care | Payer: Medicare Other | Source: Ambulatory Visit | Attending: General Surgery | Admitting: General Surgery

## 2022-07-08 ENCOUNTER — Encounter (HOSPITAL_BASED_OUTPATIENT_CLINIC_OR_DEPARTMENT_OTHER): Payer: Self-pay | Admitting: General Surgery

## 2022-07-08 DIAGNOSIS — D241 Benign neoplasm of right breast: Secondary | ICD-10-CM | POA: Diagnosis present

## 2022-07-08 DIAGNOSIS — G4733 Obstructive sleep apnea (adult) (pediatric): Secondary | ICD-10-CM | POA: Diagnosis not present

## 2022-07-08 DIAGNOSIS — I10 Essential (primary) hypertension: Secondary | ICD-10-CM | POA: Diagnosis not present

## 2022-07-08 DIAGNOSIS — K219 Gastro-esophageal reflux disease without esophagitis: Secondary | ICD-10-CM | POA: Insufficient documentation

## 2022-07-08 DIAGNOSIS — G4730 Sleep apnea, unspecified: Secondary | ICD-10-CM

## 2022-07-08 DIAGNOSIS — D242 Benign neoplasm of left breast: Secondary | ICD-10-CM

## 2022-07-08 DIAGNOSIS — N6315 Unspecified lump in the right breast, overlapping quadrants: Secondary | ICD-10-CM

## 2022-07-08 DIAGNOSIS — E039 Hypothyroidism, unspecified: Secondary | ICD-10-CM

## 2022-07-08 DIAGNOSIS — Z79899 Other long term (current) drug therapy: Secondary | ICD-10-CM | POA: Insufficient documentation

## 2022-07-08 DIAGNOSIS — Z01818 Encounter for other preprocedural examination: Secondary | ICD-10-CM

## 2022-07-08 HISTORY — DX: Unspecified lump in the right breast, unspecified quadrant: N63.10

## 2022-07-08 HISTORY — DX: Snoring: R06.83

## 2022-07-08 HISTORY — PX: RADIOACTIVE SEED GUIDED EXCISIONAL BREAST BIOPSY: SHX6490

## 2022-07-08 HISTORY — DX: Hypothyroidism, unspecified: E03.9

## 2022-07-08 HISTORY — DX: Sleep apnea, unspecified: G47.30

## 2022-07-08 SURGERY — RADIOACTIVE SEED GUIDED BREAST BIOPSY
Anesthesia: General | Site: Breast | Laterality: Right

## 2022-07-08 MED ORDER — 0.9 % SODIUM CHLORIDE (POUR BTL) OPTIME
TOPICAL | Status: DC | PRN
  Administered 2022-07-08: 1000 mL

## 2022-07-08 MED ORDER — CHLORHEXIDINE GLUCONATE CLOTH 2 % EX PADS: 6.0000 | MEDICATED_PAD | Freq: Once | CUTANEOUS | Status: DC

## 2022-07-08 MED ORDER — CEFAZOLIN SODIUM-DEXTROSE 2-4 GM/100ML-% IV SOLN
2.0000 g | INTRAVENOUS | Status: AC
  Administered 2022-07-08: 2 g via INTRAVENOUS

## 2022-07-08 MED ORDER — PROPOFOL 10 MG/ML IV BOLUS
INTRAVENOUS | Status: DC | PRN
  Administered 2022-07-08: 150 mg via INTRAVENOUS

## 2022-07-08 MED ORDER — ONDANSETRON HCL 4 MG/2ML IJ SOLN
INTRAMUSCULAR | Status: DC | PRN
  Administered 2022-07-08: 4 mg via INTRAVENOUS

## 2022-07-08 MED ORDER — ACETAMINOPHEN 500 MG PO TABS
ORAL_TABLET | ORAL | Status: AC
  Filled 2022-07-08: qty 2

## 2022-07-08 MED ORDER — DEXAMETHASONE SODIUM PHOSPHATE 4 MG/ML IJ SOLN
INTRAMUSCULAR | Status: DC | PRN
  Administered 2022-07-08: 4 mg via INTRAVENOUS

## 2022-07-08 MED ORDER — FENTANYL CITRATE (PF) 100 MCG/2ML IJ SOLN: 25.0000 ug | INTRAMUSCULAR | Status: DC | PRN

## 2022-07-08 MED ORDER — LIDOCAINE 2% (20 MG/ML) 5 ML SYRINGE
INTRAMUSCULAR | Status: DC | PRN
  Administered 2022-07-08: 60 mg via INTRAVENOUS

## 2022-07-08 MED ORDER — PROPOFOL 500 MG/50ML IV EMUL
INTRAVENOUS | Status: DC | PRN
  Administered 2022-07-08: 150 ug/kg/min via INTRAVENOUS

## 2022-07-08 MED ORDER — CELECOXIB 200 MG PO CAPS
ORAL_CAPSULE | ORAL | Status: AC
  Filled 2022-07-08: qty 1

## 2022-07-08 MED ORDER — AMISULPRIDE (ANTIEMETIC) 5 MG/2ML IV SOLN: 10.0000 mg | Freq: Once | INTRAVENOUS | Status: DC | PRN

## 2022-07-08 MED ORDER — CELECOXIB 200 MG PO CAPS
200.0000 mg | ORAL_CAPSULE | Freq: Once | ORAL | Status: AC
  Administered 2022-07-08: 200 mg via ORAL

## 2022-07-08 MED ORDER — FENTANYL CITRATE (PF) 100 MCG/2ML IJ SOLN
INTRAMUSCULAR | Status: AC
  Filled 2022-07-08: qty 2

## 2022-07-08 MED ORDER — LACTATED RINGERS IV SOLN: INTRAVENOUS | Status: DC

## 2022-07-08 MED ORDER — PROPOFOL 500 MG/50ML IV EMUL
INTRAVENOUS | Status: AC
  Filled 2022-07-08: qty 150

## 2022-07-08 MED ORDER — FENTANYL CITRATE (PF) 100 MCG/2ML IJ SOLN
INTRAMUSCULAR | Status: DC | PRN
Start: 2022-07-08 — End: 2022-07-08
  Administered 2022-07-08 (×2): 50 ug via INTRAVENOUS

## 2022-07-08 MED ORDER — PROMETHAZINE HCL 25 MG/ML IJ SOLN: 6.2500 mg | INTRAMUSCULAR | Status: DC | PRN

## 2022-07-08 MED ORDER — CEFAZOLIN SODIUM-DEXTROSE 2-4 GM/100ML-% IV SOLN
INTRAVENOUS | Status: AC
  Filled 2022-07-08: qty 100

## 2022-07-08 MED ORDER — BUPIVACAINE HCL (PF) 0.25 % IJ SOLN
INTRAMUSCULAR | Status: DC | PRN
  Administered 2022-07-08: 5 mL

## 2022-07-08 MED ORDER — ACETAMINOPHEN 500 MG PO TABS
1000.0000 mg | ORAL_TABLET | ORAL | Status: AC
  Administered 2022-07-08: 1000 mg via ORAL

## 2022-07-08 MED ORDER — ENSURE PRE-SURGERY PO LIQD: 296.0000 mL | Freq: Once | ORAL | Status: DC

## 2022-07-08 SURGICAL SUPPLY — 56 items
ADH SKN CLS APL DERMABOND .7 (GAUZE/BANDAGES/DRESSINGS) ×1
APL PRP STRL LF DISP 70% ISPRP (MISCELLANEOUS) ×1
APPLIER CLIP 9.375 MED OPEN (MISCELLANEOUS)
APR CLP MED 9.3 20 MLT OPN (MISCELLANEOUS)
BINDER BREAST LRG (GAUZE/BANDAGES/DRESSINGS) IMPLANT
BINDER BREAST MEDIUM (GAUZE/BANDAGES/DRESSINGS) IMPLANT
BINDER BREAST XLRG (GAUZE/BANDAGES/DRESSINGS) IMPLANT
BINDER BREAST XXLRG (GAUZE/BANDAGES/DRESSINGS) IMPLANT
BLADE SURG 15 STRL LF DISP TIS (BLADE) ×1 IMPLANT
BLADE SURG 15 STRL SS (BLADE) ×2
CANISTER SUC SOCK COL 7IN (MISCELLANEOUS) IMPLANT
CANISTER SUCT 1200ML W/VALVE (MISCELLANEOUS) IMPLANT
CHLORAPREP W/TINT 26 (MISCELLANEOUS) ×2 IMPLANT
CLIP APPLIE 9.375 MED OPEN (MISCELLANEOUS) IMPLANT
CLIP TI WIDE RED SMALL 6 (CLIP) IMPLANT
COVER BACK TABLE 60X90IN (DRAPES) ×2 IMPLANT
COVER MAYO STAND STRL (DRAPES) ×2 IMPLANT
COVER PROBE W GEL 5X96 (DRAPES) ×2 IMPLANT
DERMABOND ADVANCED (GAUZE/BANDAGES/DRESSINGS) ×1
DERMABOND ADVANCED .7 DNX12 (GAUZE/BANDAGES/DRESSINGS) ×1 IMPLANT
DRAPE LAPAROSCOPIC ABDOMINAL (DRAPES) ×2 IMPLANT
DRAPE UTILITY XL STRL (DRAPES) ×2 IMPLANT
DRSG TEGADERM 4X4.75 (GAUZE/BANDAGES/DRESSINGS) IMPLANT
ELECT COATED BLADE 2.86 ST (ELECTRODE) ×2 IMPLANT
ELECT REM PT RETURN 9FT ADLT (ELECTROSURGICAL) ×2
ELECTRODE REM PT RTRN 9FT ADLT (ELECTROSURGICAL) ×1 IMPLANT
GAUZE SPONGE 4X4 12PLY STRL LF (GAUZE/BANDAGES/DRESSINGS) IMPLANT
GLOVE BIO SURGEON STRL SZ7 (GLOVE) ×4 IMPLANT
GLOVE BIOGEL PI IND STRL 7.5 (GLOVE) ×1 IMPLANT
GLOVE BIOGEL PI INDICATOR 7.5 (GLOVE) ×1
GOWN STRL REUS W/ TWL LRG LVL3 (GOWN DISPOSABLE) ×2 IMPLANT
GOWN STRL REUS W/TWL LRG LVL3 (GOWN DISPOSABLE) ×4
HEMOSTAT ARISTA ABSORB 3G PWDR (HEMOSTASIS) IMPLANT
KIT MARKER MARGIN INK (KITS) ×2 IMPLANT
NDL HYPO 25X1 1.5 SAFETY (NEEDLE) ×1 IMPLANT
NEEDLE HYPO 25X1 1.5 SAFETY (NEEDLE) ×2 IMPLANT
NS IRRIG 1000ML POUR BTL (IV SOLUTION) ×1 IMPLANT
PACK BASIN DAY SURGERY FS (CUSTOM PROCEDURE TRAY) ×2 IMPLANT
PENCIL SMOKE EVACUATOR (MISCELLANEOUS) ×2 IMPLANT
RETRACTOR ONETRAX LX 90X20 (MISCELLANEOUS) IMPLANT
SLEEVE SCD COMPRESS KNEE MED (STOCKING) ×2 IMPLANT
SPIKE FLUID TRANSFER (MISCELLANEOUS) IMPLANT
SPONGE T-LAP 4X18 ~~LOC~~+RFID (SPONGE) ×2 IMPLANT
STRIP CLOSURE SKIN 1/2X4 (GAUZE/BANDAGES/DRESSINGS) ×2 IMPLANT
SUT MNCRL AB 4-0 PS2 18 (SUTURE) ×1 IMPLANT
SUT MON AB 5-0 PS2 18 (SUTURE) ×1 IMPLANT
SUT SILK 2 0 SH (SUTURE) ×1 IMPLANT
SUT VIC AB 2-0 SH 27 (SUTURE) ×4
SUT VIC AB 2-0 SH 27XBRD (SUTURE) ×1 IMPLANT
SUT VIC AB 3-0 SH 27 (SUTURE) ×4
SUT VIC AB 3-0 SH 27X BRD (SUTURE) ×1 IMPLANT
SYR CONTROL 10ML LL (SYRINGE) ×2 IMPLANT
TOWEL GREEN STERILE FF (TOWEL DISPOSABLE) ×2 IMPLANT
TRAY FAXITRON CT DISP (TRAY / TRAY PROCEDURE) ×2 IMPLANT
TUBE CONNECTING 20X1/4 (TUBING) IMPLANT
YANKAUER SUCT BULB TIP NO VENT (SUCTIONS) IMPLANT

## 2022-07-08 NOTE — Interval H&P Note (Signed)
History and Physical Interval Note:  07/08/2022 9:42 AM  Gregory Simpson  has presented today for surgery, with the diagnosis of RIGHT BREAST SPINDLE CELL NEOPLASM x 2.  The various methods of treatment have been discussed with the patient and family. After consideration of risks, benefits and other options for treatment, the patient has consented to  Procedure(s): RADIOACTIVE SEED GUIDED EXCISION OF 2 RIGHT BREAST MASSES (Right) as a surgical intervention.  The patient's history has been reviewed, patient examined, no change in status, stable for surgery.  I have reviewed the patient's chart and labs.  Questions were answered to the patient's satisfaction.     Rolm Bookbinder

## 2022-07-08 NOTE — Anesthesia Procedure Notes (Signed)
Procedure Name: LMA Insertion Date/Time: 07/08/2022 10:02 AM  Performed by: Priyah Schmuck, Ernesta Amble, CRNAPre-anesthesia Checklist: Patient identified, Emergency Drugs available, Suction available and Patient being monitored Patient Re-evaluated:Patient Re-evaluated prior to induction Oxygen Delivery Method: Circle system utilized Preoxygenation: Pre-oxygenation with 100% oxygen Induction Type: IV induction Ventilation: Mask ventilation without difficulty LMA: LMA inserted LMA Size: 4.0 Number of attempts: 1 Airway Equipment and Method: Bite block Placement Confirmation: positive ETCO2 Tube secured with: Tape Dental Injury: Teeth and Oropharynx as per pre-operative assessment

## 2022-07-08 NOTE — Transfer of Care (Signed)
Immediate Anesthesia Transfer of Care Note  Patient: Gregory Simpson  Procedure(s) Performed: RADIOACTIVE SEED GUIDED EXCISION OF 2 RIGHT BREAST MASSES (Right: Breast)  Patient Location: PACU  Anesthesia Type:General  Level of Consciousness: drowsy and patient cooperative  Airway & Oxygen Therapy: Patient Spontanous Breathing and Patient connected to face mask oxygen  Post-op Assessment: Report given to RN and Post -op Vital signs reviewed and stable  Post vital signs: Reviewed and stable  Last Vitals:  Vitals Value Taken Time  BP    Temp    Pulse 63 07/08/22 1051  Resp 39 07/08/22 1051  SpO2 96 % 07/08/22 1051  Vitals shown include unvalidated device data.  Last Pain:  Vitals:   07/08/22 0814  TempSrc: Oral  PainSc: 0-No pain      Patients Stated Pain Goal: 4 (23/34/35 6861)  Complications: No notable events documented.

## 2022-07-08 NOTE — H&P (Signed)
71 year old male who has a history of hypertension who has followed by his cardiologist. He underwent a cardiac CT with the finding of a right breast mass. He was then evaluated with mammography. This showed in the 12 o'clock position 2 adjacent oval masses additionally there is another 1 in the upper inner right breast. He underwent an ultrasound and at the 12 o'clock position 3 cm from the nipple there is a 2.6 x 1.4 x 1.7 cm mixed solid mass. At the 230 o'clock position 4 cm in the nipple there is a 4 x 2 x 4 mm mass. He has no axillary adenopathy. 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 He is referred for evaluation. He has no symptoms related to these and did not know they were there until he got the CT.  Review of Systems: A complete review of systems was obtained from the patient. I have reviewed this information and discussed as appropriate with the patient. See HPI as well for other ROS.  Review of Systems  All other systems reviewed and are negative.   Medical History: Past Medical History:  Diagnosis Date  Aneurysm (CMS-HCC)  Arrhythmia  Hypertension  Sleep apnea  Thyroid disease   Patient Active Problem List  Diagnosis  Agatston coronary artery calcium score less than 100  Lumbar stenosis with neurogenic claudication  OSA (obstructive sleep apnea)   Past Surgical History:  Procedure Laterality Date  right breast biop Right 03/2022  lipoma left ear removal  PHOTOREFRACTIVE KERATOTOMY/LASIK  SEPTOPLASTY    No Known Allergies  Current Outpatient Medications on File Prior to Visit  Medication Sig Dispense Refill  amLODIPine (NORVASC) 5 MG tablet Take 5 mg by mouth once daily  hydrALAZINE (APRESOLINE) 25 MG tablet Take 25 mg by mouth 2 (two) times daily  levothyroxine (SYNTHROID) 88 MCG tablet Take by mouth  losartan (COZAAR) 100 MG tablet Take 100 mg by mouth once daily    Family History  Problem Relation Age of  Onset  High blood pressure (Hypertension) Father  Stroke Father  Coronary Artery Disease (Blocked arteries around heart) Father  Coronary Artery Disease (Blocked arteries around heart) Sister    Social History   Tobacco Use  Smoking Status Never  Smokeless Tobacco Never  Marital status: Married  Tobacco Use  Smoking status: Never  Smokeless tobacco: Never  Substance and Sexual Activity  Alcohol use: Never  Drug use: Never   Objective:   Physical Exam Vitals reviewed.  Constitutional:  Appearance: Normal appearance.  Chest:  Breasts: Right: Mass present. No inverted nipple or nipple discharge.  Comments: Vague superior central breast mass, I cannot palpate the other mass at all. Lymphadenopathy:  Upper Body:  Right upper body: No supraclavicular or axillary adenopathy.  Neurological:  Mental Status: He is alert.    Assessment and Plan:    Mass of upper inner quadrant of right breast  We discussed the findings by ultrasound as well as the biopsy results. I do think that both for diagnosis as well as the small risk of these increasing in size and causing a problem locally that these should be removed. I discussed radioactive seed guided excision of both of these masses. He would like to do this in August after they have their summer and I think that that is not unreasonable given what it appears they are and it likely they have been there for quite some time to begin with. We discussed surgery risks and recovery  today.

## 2022-07-08 NOTE — Discharge Instructions (Addendum)
[place] Office Phone Number [phone]  POST OP INSTRUCTIONS Take 400 mg of ibuprofen every 8 hours or 650 mg tylenol every 6 hours for next 72 hours then as needed. Use ice several times daily also.  A prescription for pain medication may be given to you upon discharge.  Take your pain medication as prescribed, if needed.  If narcotic pain medicine is not needed, then you may take acetaminophen (Tylenol), naprosyn (Alleve) or ibuprofen (Advil) as needed. Take your usually prescribed medications unless otherwise directed If you need a refill on your pain medication, please contact your pharmacy.  They will contact our office to request authorization.  Prescriptions will not be filled after 5pm or on week-ends. You should eat very light the first 24 hours after surgery, such as soup, crackers, pudding, etc.  Resume your normal diet the day after surgery. Most patients will experience some swelling and bruising in the breast.  Ice packs will help. It is common to experience some constipation if taking pain medication after surgery.  Increasing fluid intake and taking a stool softener will usually help or prevent this problem from occurring.  A mild laxative (Milk of Magnesia or Miralax) should be taken according to package directions if there are no bowel movements after 48 hours. I used skin glue on the incision, you may shower in 24 hours.  The glue will flake off over the next 2-3 weeks.  Any sutures or Ramaker will be removed at the office during your follow-up visit. ACTIVITIES:  You may resume regular daily activities (gradually increasing) beginning the next day.  Wearing a good support bra or sports bra minimizes pain and swelling.   You may drive when you no longer are taking prescription pain medication, you can comfortably wear a seatbelt, and you can safely maneuver your car and apply brakes. RETURN TO WORK:   ______________________________________________________________________________________ Dennis Bast should see your doctor in the office for a follow-up appointment approximately two weeks after your surgery.  Your doctor's nurse will typically make your follow-up appointment when she calls you with your pathology report.  Expect your pathology report 3-4 business days after your surgery.  You may call to check if you do not hear from [place] after three days. OTHER INSTRUCTIONS: _______________________________________________________________________________________________ _____________________________________________________________________________________________________________________________________ _____________________________________________________________________________________________________________________________________ _____________________________________________________________________________________________________________________________________  WHEN TO CALL DR [place]: Fever over 101.0 Nausea and/or vomiting. Extreme swelling or bruising. Continued bleeding from incision. Increased pain, redness, or drainage from the incision.  The clinic staff is available to answer your questions during regular business hours.  Please don't hesitate to call and ask to speak to one of the nurses for clinical concerns.  If you have a medical emergency, go to the nearest emergency room or call 911.  A surgeon from [facility] is always on call at the hospital.  For further questions, please visit [email or web address]   May take Tylenol after 2:15pm, if needed. May take NSAIDS (ibuprofen, motrin), if needed.   Post Anesthesia Home Care Instructions  Activity: Get plenty of rest for the remainder of the day. A responsible individual must stay with you for 24 hours following the procedure.  For the next 24 hours, DO NOT: -Drive a car -Paediatric nurse -Drink alcoholic  beverages -Take any medication unless instructed by your physician -Make any legal decisions or sign important papers.  Meals: Start with liquid foods such as gelatin or soup. Progress to regular foods as tolerated. Avoid greasy, spicy, heavy foods. If nausea and/or vomiting occur, drink only  clear liquids until the nausea and/or vomiting subsides. Call your physician if vomiting continues.  Special Instructions/Symptoms: Your throat may feel dry or sore from the anesthesia or the breathing tube placed in your throat during surgery. If this causes discomfort, gargle with warm salt water. The discomfort should disappear within 24 hours.  If you had a scopolamine patch placed behind your ear for the management of post- operative nausea and/or vomiting:  1. The medication in the patch is effective for 72 hours, after which it should be removed.  Wrap patch in a tissue and discard in the trash. Wash hands thoroughly with soap and water. 2. You may remove the patch earlier than 72 hours if you experience unpleasant side effects which may include dry mouth, dizziness or visual disturbances. 3. Avoid touching the patch. Wash your hands with soap and water after contact with the patch.

## 2022-07-08 NOTE — Anesthesia Postprocedure Evaluation (Signed)
Anesthesia Post Note  Patient: Gregory Simpson  Procedure(s) Performed: RADIOACTIVE SEED GUIDED EXCISION OF 2 RIGHT BREAST MASSES (Right: Breast)     Patient location during evaluation: PACU Anesthesia Type: General Level of consciousness: sedated Pain management: pain level controlled Vital Signs Assessment: post-procedure vital signs reviewed and stable Respiratory status: spontaneous breathing and respiratory function stable Cardiovascular status: stable Postop Assessment: no apparent nausea or vomiting Anesthetic complications: no   No notable events documented.  Last Vitals:  Vitals:   07/08/22 1115 07/08/22 1155  BP: 129/84 139/86  Pulse: 64 64  Resp: 17 18  Temp:  (!) 36.3 C  SpO2: 93% 97%    Last Pain:  Vitals:   07/08/22 1155  TempSrc: Oral  PainSc: 0-No pain                 Jaley Yan DANIEL

## 2022-07-08 NOTE — Op Note (Signed)
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 Serita Grammes Anesthesia: General Estimated blood loss: Minimal Complications: None Drains: None Specimens: 1.  Right medial breast mass marked with paint containing seed and clip 2.  Central breast mass containing seed marked with paint Sponge and count was correct at completion Disposition recovery stable condition  Indications: This is a 71 year old male who underwent a cardiac CT with a finding of a right breast mass.  He underwent mammogram and ultrasound.  It showed a couple of masses that were present.  There was a 2.6 cm mass at 12:00 and at 230 o'clock there was a 4 mm mass.  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  He and I discussed removing both of these with seed guidance.  Procedure: After informed consent was obtained the patient was taken to the operating room.  He was given antibiotics.  SCDs were in place.  He was placed under general anesthesia without complication.  He was prepped and draped in the standard sterile surgical fashion.  Surgical timeout was then performed.  The 2 lesions were quite away from each other so I did this through 2 separate incisions  I first remove the medial lesion.  I infiltrated Marcaine and lidocaine mixture and then made a curvilinear incision over the seed.  I remove the seed and the surrounding tissue.  Mammogram confirmed removal of the seed and the clip.  I obtained hemostasis.  This was closed with 2-0 Vicryl, 3-0 Vicryl, and 4 Monocryl.  Glue was placed.  For the central lesion I went through a periareolar incision and dissected down as this wound was closer to the chest wall.  The mass was palpable at this position.  I remove the mass and some of the surrounding tissue.  Mammogram  confirmed removal of the seed.  The clip was not exactly at the lesion so I was not sure I remove the correct lesion then I did not proceed any further.  Hemostasis was then obtained.  I closed this with 2-0 Vicryl, 3-0 Vicryl, and 5-0 Monocryl.  Glue Steri-Strips were applied.  He tolerated this well was extubated and transferred to recovery in stable condition.

## 2022-07-09 ENCOUNTER — Encounter (HOSPITAL_BASED_OUTPATIENT_CLINIC_OR_DEPARTMENT_OTHER): Payer: Self-pay | Admitting: General Surgery

## 2022-07-09 LAB — SURGICAL PATHOLOGY

## 2022-08-04 ENCOUNTER — Encounter (HOSPITAL_COMMUNITY): Payer: Self-pay

## 2022-08-27 ENCOUNTER — Encounter (HOSPITAL_BASED_OUTPATIENT_CLINIC_OR_DEPARTMENT_OTHER): Payer: Self-pay | Admitting: Cardiology

## 2022-09-02 ENCOUNTER — Other Ambulatory Visit: Payer: Medicare Other

## 2022-09-07 ENCOUNTER — Other Ambulatory Visit (HOSPITAL_COMMUNITY): Payer: Medicare Other

## 2022-09-09 MED ORDER — ROSUVASTATIN CALCIUM 5 MG PO TABS: 5.0000 mg | ORAL_TABLET | Freq: Every day | ORAL | 3 refills | Status: DC

## 2022-09-09 NOTE — Telephone Encounter (Signed)
Spoke with the patient and his wife. He does not wish to increase his rosuvastatin 10 mg daily.

## 2022-09-24 ENCOUNTER — Encounter: Payer: Self-pay | Admitting: Cardiology

## 2022-09-24 ENCOUNTER — Ambulatory Visit (HOSPITAL_COMMUNITY)
Admission: RE | Admit: 2022-09-24 | Discharge: 2022-09-24 | Disposition: A | Discharge status: Home / Self Care | Payer: Medicare Other | Source: Ambulatory Visit | Attending: Cardiology | Admitting: Cardiology

## 2022-09-24 ENCOUNTER — Telehealth: Payer: Self-pay | Admitting: Cardiology

## 2022-09-24 DIAGNOSIS — I77810 Thoracic aortic ectasia: Secondary | ICD-10-CM

## 2022-09-24 MED ORDER — IOHEXOL 350 MG/ML SOLN
100.0000 mL | Freq: Once | INTRAVENOUS | Status: AC | PRN
  Administered 2022-09-24: 100 mL via INTRAVENOUS

## 2022-09-24 NOTE — Telephone Encounter (Signed)
The patient has been notified of the result and verbalized understanding.  All questions (if any) were answered. Bernestine Amass, RN 09/24/2022 12:32 PM   Referral has been placed.

## 2022-09-24 NOTE — Telephone Encounter (Signed)
Wife was returning call. Please advise ?

## 2022-10-07 ENCOUNTER — Institutional Professional Consult (permissible substitution) (INDEPENDENT_AMBULATORY_CARE_PROVIDER_SITE_OTHER): Payer: Medicare Other | Admitting: Cardiothoracic Surgery

## 2022-10-07 VITALS — BP 140/77 | HR 47 | Resp 20 | Ht 68.0 in | Wt 238.0 lb

## 2022-10-07 DIAGNOSIS — I7121 Aneurysm of the ascending aorta, without rupture: Secondary | ICD-10-CM | POA: Diagnosis not present

## 2022-10-07 NOTE — Progress Notes (Signed)
[address]       [place] [address]             [phone]                    Donnavin F Goins [place] Medical Record #[id number] Date of Birth: 1950/12/05  Referring: Sueanne Margarita, MD Primary Care: Clemmie Krill, FNP Primary Cardiologist: Fransico Him, MD  Chief Complaint:    Chief Complaint  Patient presents with   Thoracic Aortic Aneurysm    Surgical consult, CTA Chest 09/24/22    History of Present Illness:    Gregory Simpson 71 y.o. male who presents for a 5.0 cm ascending aortic aneurysm. He attends clinic with his wife, who was an ICU nurse for 20 years. Back in April it was 4.6cm  No family history of aortic aneurysm/dissection/sudden death. Reports currently no SOB or CP     Past Medical History:  Diagnosis Date   Agatston coronary artery calcium score less than 100    Coronary calcium score 84 which is 33rd percentile age, gender, race matched controls   Ascending aortic aneurysm ([place])    4.8cm x 5cm on Chest CTA 08/2022   Breast mass, right    x2   GERD (gastroesophageal reflux disease)    HTN (hypertension)    Hypothyroidism    Left anterior hemiblock    Murmur    Nocturia    Sinus bradycardia    Sleep apnea    does not use CPAP   Snores    Spinal stenosis    SVT (supraventricular tachycardia)    Thyroid disease     Past Surgical History:  Procedure Laterality Date   BACK SURGERY     L3-L4   BREAST BIOPSY Right 04/09/2022   CARPAL TUNNEL RELEASE     INSERTION OF INTERSPINOUS/INTERLAMINAR PROCESS STABILIZATION/DISTRACTION DEVICE     LASIK Bilateral    RADIOACTIVE SEED GUIDED EXCISIONAL BREAST BIOPSY Right 07/08/2022   Procedure: RADIOACTIVE SEED GUIDED EXCISION OF 2 RIGHT BREAST MASSES;  Surgeon: Rolm Bookbinder, MD;  Location: [facility];  Service: General;  Laterality: Right;   SEPTOPLASTY     TONSILLECTOMY     UVULECTOMY      Family History  Problem Relation Age of Onset   Dementia  Mother    Stroke Father      Social History   Tobacco Use  Smoking Status Never  Smokeless Tobacco Never    Social History   Substance and Sexual Activity  Alcohol Use Yes   Comment: red wine daily     No Known Allergies  Current Outpatient Medications  Medication Sig Dispense Refill   amLODipine (NORVASC) 5 MG tablet Take 10 mg by mouth daily.     esomeprazole (NEXIUM) 20 MG packet Take 20 mg by mouth daily before breakfast.     hydrALAZINE (APRESOLINE) 25 MG tablet Take 25 mg by mouth 2 (two) times daily.     levothyroxine (SYNTHROID) 88 MCG tablet Take 88 mcg by mouth daily before breakfast.     losartan (COZAAR) 100 MG tablet Take 100 mg by mouth daily.     rosuvastatin (CRESTOR) 5 MG tablet Take 1 tablet (5 mg total) by mouth daily. 90 tablet 3   No current facility-administered medications for this visit.    ROS 14 point ROS reviewed and negative except as per HPI   PHYSICAL EXAMINATION: BP (!) 140/77  Pulse (!) 47   Resp 20   Ht '5\' 8"'$  (1.727 m)   Wt 238 lb (108 kg)   SpO2 95% Comment: RA  BMI 36.19 kg/m   Gen: NAD Neuro: Alert and oriented Resp: Nonlaboured Abd: Soft, ntnd Extr: WWP  Diagnostic Studies & Laboratory data:     Recent Radiology Findings:   CT ANGIO CHEST AORTA W/CM & OR WO/CM  Result Date: 09/24/2022 CLINICAL DATA:  Ascending aortic aneurysm follow-up. EXAM: CT ANGIOGRAPHY CHEST WITH CONTRAST TECHNIQUE: Multidetector CT imaging of the chest was performed using the standard protocol during bolus administration of intravenous contrast. Multiplanar CT image reconstructions and MIPs were obtained to evaluate the vascular anatomy. RADIATION DOSE REDUCTION: This exam was performed according to the departmental dose-optimization program which includes automated exposure control, adjustment of the mA and/or kV according to patient size and/or use of iterative reconstruction technique. CONTRAST:  123m OMNIPAQUE IOHEXOL 350 MG/ML SOLN  COMPARISON:  CT chest dated 03/23/2022. FINDINGS: Cardiovascular: Preferential opacification of the thoracic aorta. No evidence of thoracic aortic dissection. Normal heart size. No pericardial effusion. The ascending aorta is not significantly changed in size since 03/23/2022: Aortic annulus measuring 27 mm Sino-tubular junction measuring 30 mm Ascending aorta at the level of the main pulmonary artery measuring 48 x 50 mm the remainder of the thoracic aorta appears normal in caliber. Mediastinum/Nodes: No enlarged mediastinal, hilar, or axillary lymph nodes. Thyroid gland, trachea, and esophagus demonstrate no significant findings. Postoperative changes are seen in the right breast. Lungs/Pleura: Lungs are clear. No pleural effusion or pneumothorax. Upper Abdomen: No acute abnormality. Musculoskeletal: Degenerative changes are seen in the spine. Review of the MIP images confirms the above findings. IMPRESSION: 1. Ascending thoracic aorta measures 48 x 50 mm at the level of the main pulmonary artery, not significantly changed in size since 03/23/2022. Recommend semi-annual imaging followup by CTA or MRA and referral to cardiothoracic surgery if not already obtained. This recommendation follows 2010 ACCF/AHA/AATS/ACR/ASA/SCA/SCAI/SIR/STS/SVM Guidelines for the Diagnosis and Management of Patients With Thoracic Aortic Disease. Circulation. 2010; 121:: O035-D974 Aortic aneurysm NOS (ICD10-I71.9) Electronically Signed   By: TZerita BoersM.D.   On: 09/24/2022 10:38       I have independently reviewed the above radiology studies  and reviewed the findings with the patient.   Recent Lab Findings: Lab Results  Component Value Date   GLUCOSE 97 03/16/2022   CHOL 129 06/14/2022   TRIG 166 (H) 06/14/2022   HDL 27 (L) 06/14/2022   LDLCALC 73 06/14/2022   ALT 22 06/09/2022   NA 143 03/16/2022   K 4.7 03/16/2022   CL 107 (H) 03/16/2022   CREATININE 0.98 03/16/2022   BUN 20 03/16/2022   CO2 25 03/16/2022      Assessment / Plan:   65M with 5.0cm ascending aortic aneurysm  Plan: CTA Chest in 6 months.   Prior to 2022 the guidelines which advocate for 5.0 cm, the threshold was 5.5cm.   Wife is a former ICU nurse and understands well issues at play. Will have further discussion when returns regarding threshold for aortic replacement.  Discussed signs/symptoms of aortic dissection.  Counseled re blood pressure control.  Brief overview of expected hospital course and risks/benefits/alternatives of aortic aneurysm surgery when and if we get there in the future.  DPierre BaliEnter 10/07/2022 7:48 PM

## 2022-10-11 ENCOUNTER — Encounter: Payer: Self-pay | Admitting: Neurology

## 2022-10-14 LAB — COLOGUARD: COLOGUARD: NEGATIVE

## 2022-10-19 NOTE — Progress Notes (Signed)
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  Start indomethacin trial - '25mg'$  three times daily.  We can increase to '50mg'$  three times daily in 2 weeks if needed Otherwise, follow up 4-5 months.   Subjective:  Gregory Simpson is a 71 year old male with HTN, SVT, hypothyroidism, ascending aortic aneurysm and moderate sleep apnea, chronic low back pain with sciatica and history of TIA in 1987 who presents for occipital neuralgia.  History supplemented by referring provider's note.  Since he was a young man, he has had recurrent right sided headache.  He describes an aching pain involving the entire right side of his head, right side of forehead, nose and around the eye, radiating down the right side of neck into the right shoulder.  It is primarily an aching pain but with a sharp stabbing pain in the right posterior parietal region.  There is some associated blurred vision, photophobia and phonophobia but denies nausea, vomiting and autonomic symptoms.  However, his right nostril is chronically stuffed up due to turbinate hypertrophy but headaches are severely triggered when it opens and he can breath through the nose.  They last 30 to 60 minutes.  He has a near daily dull aching but has severe attacks between 5 and 10 times a month.  Triggered when air moves through the right nostril, breathing through mouth, heat, and when a cold front comes in.  Staying still aggravates it.  Symptoms better when he is pacing and exposed to cold air.  As air through the right nostril aggravates the pain, he is unable to use a CPAP for his OSA and ENT is hesitant to perform any further surgical procedures to  open his nasal airway.  He has longstanding history of sinus disease and underwent sinus surgery and surgery for deviated septum which aggravated the headaches afterwards.  He was seen by neurology in the past and did not respond to multiple medications such as gabapentin, verapamil, amitriptyline and topiramate.  MRI of brain with and without contrast on 10/08/2022 personally reviewed was unremarkable.    He also has a prior history of migraines associated with nausea, photophobia, phonophobia, visual aura and allodynia.  They improved after Lasik surgery in 2001.    Prior medications:  gabapentin, verapamil, amitriptyline, topiramate     PAST MEDICAL HISTORY: Past Medical History:  Diagnosis Date   Agatston coronary artery calcium score less than 100    Coronary calcium score 63 which is 33rd percentile age, gender, race matched controls   Ascending aortic aneurysm ([place])    4.8cm x 5cm on Chest CTA 08/2022   Breast mass, right    x2   GERD (gastroesophageal reflux disease)    HTN (hypertension)    Hypothyroidism    Left anterior hemiblock    Murmur    Nocturia    Sinus bradycardia    Sleep apnea    does not use CPAP   Snores    Spinal stenosis    SVT (supraventricular tachycardia)    Thyroid disease     PAST SURGICAL HISTORY: Past Surgical History:  Procedure Laterality Date   BACK SURGERY     L3-L4   BREAST BIOPSY Right 04/09/2022   CARPAL TUNNEL RELEASE  INSERTION OF INTERSPINOUS/INTERLAMINAR PROCESS STABILIZATION/DISTRACTION DEVICE     LASIK Bilateral    RADIOACTIVE SEED GUIDED EXCISIONAL BREAST BIOPSY Right 07/08/2022   Procedure: RADIOACTIVE SEED GUIDED EXCISION OF 2 RIGHT BREAST MASSES;  Surgeon: Rolm Bookbinder, MD;  Location: [facility];  Service: General;  Laterality: Right;   SEPTOPLASTY     TONSILLECTOMY     UVULECTOMY      MEDICATIONS: Current Outpatient Medications on File Prior to Visit  Medication Sig Dispense Refill   amLODipine  (NORVASC) 5 MG tablet Take 10 mg by mouth daily.     esomeprazole (NEXIUM) 20 MG packet Take 20 mg by mouth daily before breakfast.     hydrALAZINE (APRESOLINE) 25 MG tablet Take 25 mg by mouth 2 (two) times daily.     levothyroxine (SYNTHROID) 88 MCG tablet Take 88 mcg by mouth daily before breakfast.     losartan (COZAAR) 100 MG tablet Take 100 mg by mouth daily.     No current facility-administered medications on file prior to visit.     ALLERGIES: No Known Allergies  FAMILY HISTORY: Family History  Problem Relation Age of Onset   Dementia Mother    Stroke Father     Objective:  Blood pressure (!) 155/82, pulse (!) 56, height '5\' 8"'$  (1.727 m), weight 251 lb 6.4 oz (114 kg), SpO2 97 %. General: No acute distress.  Patient appears well-groomed.   Head:  Normocephalic/atraumatic Eyes:  fundi examined but not visualized Neck: supple, no paraspinal tenderness, full range of motion Back: No paraspinal tenderness Heart: regular rate and rhythm Lungs: Clear to auscultation bilaterally. Vascular: No carotid bruits. Neurological Exam: Mental status: alert and oriented to person, place, and time, speech fluent and not dysarthric, language intact. Cranial nerves: CN I: not tested CN II: pupils equal, round and reactive to light, visual fields intact CN III, IV, VI:  full range of motion, no nystagmus, no ptosis CN V: facial sensation intact. CN VII: upper and lower face symmetric CN VIII: hearing intact CN IX, X: gag intact, uvula midline CN XI: sternocleidomastoid and trapezius muscles intact CN XII: tongue midline Bulk & Tone: normal, no fasciculations. Motor:  muscle strength 5/5 throughout Sensation:  Pinprick sensation reduced in feet, vibratory sensation reduced in left foot. . Deep Tendon Reflexes:  1+ right patellar, otherwise 2+ throughout,  toes downgoing.   Finger to nose testing:  Without dysmetria.   Heel to shin:  Without dysmetria.   Gait:  Normal station and  stride.  Romberg negative.    Thank you for allowing me to take part in the care of this patient.  Metta Clines, DO  CC:  Raechel Chute, FNP  Elise Benne, NP

## 2022-10-26 ENCOUNTER — Ambulatory Visit (INDEPENDENT_AMBULATORY_CARE_PROVIDER_SITE_OTHER): Payer: Medicare Other | Admitting: Neurology

## 2022-10-26 ENCOUNTER — Encounter: Payer: Self-pay | Admitting: Neurology

## 2022-10-26 VITALS — BP 155/82 | HR 56 | Ht 68.0 in | Wt 251.4 lb

## 2022-10-26 DIAGNOSIS — G44039 Episodic paroxysmal hemicrania, not intractable: Secondary | ICD-10-CM | POA: Diagnosis not present

## 2022-10-26 MED ORDER — INDOMETHACIN 25 MG PO CAPS: 25.0000 mg | ORAL_CAPSULE | Freq: Three times a day (TID) | ORAL | 5 refills | Status: DC

## 2022-10-26 NOTE — Patient Instructions (Signed)
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  Take 1 pill three times daily.  If no improvement in 2 weeks, contact me and we can increase dose. Follow up 4 to 5 months.

## 2022-11-11 ENCOUNTER — Telehealth: Payer: Self-pay | Admitting: Neurology

## 2022-11-11 MED ORDER — ACETAZOLAMIDE 250 MG PO TABS: 250.0000 mg | ORAL_TABLET | Freq: Three times a day (TID) | ORAL | 0 refills | Status: DC

## 2022-11-11 NOTE — Telephone Encounter (Signed)
Patient left message with the after hour service on 11-11-22 at 12:30   Returning a call

## 2022-11-11 NOTE — Telephone Encounter (Signed)
Patient's wife is returning a call said that they are at home now and are able to answer.
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
## 2022-11-11 NOTE — Telephone Encounter (Signed)
Pt's wife called back in and left a message. She is returning our call. She wants the pt to change medication.

## 2022-11-11 NOTE — Telephone Encounter (Signed)
Per Dr Tomi Likens, Discontinue indomethacin.  If patient agreeable, please send prescription for acetazolamide '250mg'$  three times daily.  Side effects may include numbness and tingling sensation, so he should not be worried IF he experiences this.      Per patient and his wife wanted to know if there is any other medications the patient could take? Per wife patient already tried Diuretic.  They will try ir for now but please advise of other medications.

## 2022-11-11 NOTE — Telephone Encounter (Signed)
Tried calling patient, No answer , LMOVM how long did the patient take the 50 mg of Indomethacin?

## 2022-11-12 ENCOUNTER — Encounter: Payer: Self-pay | Admitting: Neurology

## 2022-11-12 NOTE — Telephone Encounter (Signed)
We have to take each step one at a time.  If he hasn't specifically tried acetazolamide, I would like to give it a chance.   LMOVM for patient's wife.

## 2022-11-15 ENCOUNTER — Other Ambulatory Visit: Payer: Self-pay | Admitting: Neurology

## 2022-11-15 MED ORDER — BACLOFEN 10 MG PO TABS: 10.0000 mg | ORAL_TABLET | Freq: Three times a day (TID) | ORAL | 5 refills | Status: DC | PRN

## 2023-03-10 ENCOUNTER — Ambulatory Visit: Payer: Medicare Other | Admitting: Neurology

## 2023-03-10 ENCOUNTER — Other Ambulatory Visit: Payer: Self-pay | Admitting: Cardiothoracic Surgery

## 2023-03-10 DIAGNOSIS — I7121 Aneurysm of the ascending aorta, without rupture: Secondary | ICD-10-CM

## 2023-03-14 ENCOUNTER — Ambulatory Visit: Payer: Medicare Other | Admitting: Neurology

## 2023-03-18 ENCOUNTER — Ambulatory Visit (HOSPITAL_COMMUNITY)
Admission: RE | Admit: 2023-03-18 | Discharge: 2023-03-18 | Disposition: A | Discharge status: Home / Self Care | Payer: Medicare Other | Source: Ambulatory Visit | Attending: Cardiothoracic Surgery | Admitting: Cardiothoracic Surgery

## 2023-03-18 ENCOUNTER — Encounter (HOSPITAL_COMMUNITY): Payer: Self-pay

## 2023-03-18 DIAGNOSIS — I7121 Aneurysm of the ascending aorta, without rupture: Secondary | ICD-10-CM | POA: Diagnosis present

## 2023-03-18 MED ORDER — IOHEXOL 350 MG/ML SOLN
100.0000 mL | Freq: Once | INTRAVENOUS | Status: AC | PRN
  Administered 2023-03-18: 100 mL via INTRAVENOUS

## 2023-03-31 ENCOUNTER — Ambulatory Visit: Payer: Medicare Other | Admitting: Cardiothoracic Surgery

## 2023-04-04 ENCOUNTER — Ambulatory Visit (INDEPENDENT_AMBULATORY_CARE_PROVIDER_SITE_OTHER): Payer: Medicare Other | Admitting: Surgery

## 2023-04-04 ENCOUNTER — Encounter: Payer: Self-pay | Admitting: Surgery

## 2023-04-04 VITALS — BP 121/54 | HR 54 | Resp 20 | Ht 68.0 in | Wt 240.0 lb

## 2023-04-04 DIAGNOSIS — I7121 Aneurysm of the ascending aorta, without rupture: Secondary | ICD-10-CM | POA: Diagnosis not present

## 2023-04-05 NOTE — Progress Notes (Signed)
HPI:  The patient is a 72 year old gentleman with a history of hypertension and an ascending aortic aneurysm that was seen on echocardiogram done in [place] in February 2023 that was noted to be 4.3 cm.  His aortic valve was trileaflet with trivial regurgitation.  He had a cardiac calcium scoring CT on 03/09/2022 that was read as showing a 5.5 cm fusiform ascending aortic aneurysm.  Formal CTA of the chest on 03/23/2022 showed an ascending aortic diameter of 4.6 cm.  He had a follow-up CTA of the chest on 09/24/2022 which showed the ascending aorta to have a diameter of 48 x 50 mm.  He was seen in initial consultation by Dr. Delia Chimes on 10/07/2022 and 65-month follow-up was recommended.  He has continued to do well.  He checks his blood pressure periodically at home and is usually under good control.  His wife is with him today who is a retired Engineer, civil (consulting).  He reports that his father had a descending thoracic aneurysm but died from other causes.  There is no family history of connective tissue disorder or aortic dissection.  Current Outpatient Medications  Medication Sig Dispense Refill   amLODipine (NORVASC) 5 MG tablet Take 10 mg by mouth daily.     CRESTOR 5 MG tablet      esomeprazole (NEXIUM) 20 MG packet Take 20 mg by mouth daily before breakfast.     hydrALAZINE (APRESOLINE) 25 MG tablet Take 25 mg by mouth 3 (three) times daily.     levothyroxine (SYNTHROID) 88 MCG tablet Take 88 mcg by mouth daily before breakfast.     losartan (COZAAR) 100 MG tablet Take 100 mg by mouth daily.     No current facility-administered medications for this visit.     Physical Exam: BP (!) 121/54 (BP Location: Left Arm, Patient Position: Sitting)   Pulse (!) 54   Resp 20   Ht 5\' 8"  (1.727 m)   Wt 240 lb (108.9 kg)   SpO2 94% Comment: RA  BMI 36.49 kg/m  He looks well. Cardiac exam shows a regular rate and rhythm with normal heart sounds.  There is no murmur. Lungs are clear. There is no peripheral  edema.  Diagnostic Tests:  Narrative & Impression  CLINICAL DATA:  Aortic aneurysm.   EXAM: CT ANGIOGRAPHY CHEST WITH CONTRAST   TECHNIQUE: Multidetector CT imaging of the chest was performed using the standard protocol during bolus administration of intravenous contrast. Multiplanar CT image reconstructions and MIPs were obtained to evaluate the vascular anatomy.   RADIATION DOSE REDUCTION: This exam was performed according to the departmental dose-optimization program which includes automated exposure control, adjustment of the mA and/or kV according to patient size and/or use of iterative reconstruction technique.   CONTRAST:  OMNIPAQUE IOHEXOL 350 MG/ML SOLN   COMPARISON:  CTA chest 09/24/2022   FINDINGS: Cardiovascular: The ascending thoracic aorta is aneurysmal measuring up to 5.2 cm in the axial plane (5-49), and 4.8 cm in the coronal plane (9-68), not significantly changed since the prior study. The sino-tubular junction measures 2.8 cm. There is no evidence of dissection. The pulmonary vasculature is suboptimally opacified and not well assessed.   The heart size is normal.  There is no pericardial effusion.   Mediastinum/Nodes: The thyroid is unremarkable. The esophagus is grossly unremarkable. A few prominent but not pathologically enlarged mediastinal lymph nodes are unchanged, nonspecific. There is no new or progressive mediastinal, hilar, or axillary lymphadenopathy.   Lungs/Pleura: The trachea and central  airways are patent.   The lungs are clear, with no focal consolidation or pulmonary edema. There is no pleural effusion or pneumothorax.   Upper Abdomen: A 1.1 cm right adrenal nodule is unchanged, likely a benign adenoma requiring no specific imaging follow-up. The imaged portions of the upper abdominal viscera are otherwise unremarkable.   Musculoskeletal: There is no acute osseous abnormality or suspicious osseous lesion.   Review of the  MIP images confirms the above findings.   IMPRESSION: Unchanged aneurysmal dilation of the ascending thoracic aorta measuring up to 5.2 cm. Recommend continued semiannual imaging follow-up.     Electronically Signed   By: Lesia Hausen M.D.   On: 03/18/2023 16:12      Impression:  This 72 year old gentleman has a fusiform ascending aortic aneurysm with measurements that have ranged from 4.3 cm on echocardiogram to 5.5 cm on a cardiac scoring CT around the same time.  I have personally reviewed all of his CTA studies and done my own measurements at the same level.  I think his aneurysm has been stable and the measurement is about 4.8 cm.  I think the variability in measurements has been due to measurement error from measurements being done at different levels and not always perpendicular to the plane of the aorta.  I think his aortic diameter is still below the surgical threshold of 5.5 cm in a patient with a trileaflet aortic valve.  I reviewed the CT images with him and his wife and answered their questions.  I have recommended continued follow-up with a repeat scan in 6 months.  I stressed the importance of continued good blood pressure control in preventing further enlargement and acute aortic dissection.  I advised him against doing any heavy lifting or strenuous physical activity that may require a Valsalva maneuver and could suddenly raise his blood pressure to high levels.  Plan:  I will see him back in 6 months with a CTA of the chest for aortic surveillance.  I spent 20 minutes performing this established patient evaluation and > 50% of this time was spent face to face counseling and coordinating the care of this patient's aortic aneurysm.    Alleen Borne, MD [facility] [phone]

## 2023-04-07 ENCOUNTER — Other Ambulatory Visit (HOSPITAL_COMMUNITY): Payer: Self-pay | Admitting: Neurology

## 2023-04-07 ENCOUNTER — Other Ambulatory Visit: Payer: Self-pay

## 2023-04-07 DIAGNOSIS — G459 Transient cerebral ischemic attack, unspecified: Secondary | ICD-10-CM

## 2023-04-07 DIAGNOSIS — G44021 Chronic cluster headache, intractable: Secondary | ICD-10-CM

## 2023-04-11 ENCOUNTER — Ambulatory Visit (HOSPITAL_COMMUNITY): Admission: RE | Admit: 2023-04-11 | Payer: Medicare Other | Source: Ambulatory Visit

## 2023-04-14 ENCOUNTER — Encounter (HOSPITAL_COMMUNITY): Payer: Self-pay

## 2023-04-14 ENCOUNTER — Ambulatory Visit (HOSPITAL_COMMUNITY): Payer: Medicare Other

## 2023-04-17 ENCOUNTER — Ambulatory Visit (HOSPITAL_COMMUNITY)
Admission: RE | Admit: 2023-04-17 | Discharge: 2023-04-17 | Disposition: A | Discharge status: Home / Self Care | Payer: Medicare Other | Source: Ambulatory Visit | Attending: Neurology | Admitting: Neurology

## 2023-04-17 DIAGNOSIS — G459 Transient cerebral ischemic attack, unspecified: Secondary | ICD-10-CM

## 2023-04-17 DIAGNOSIS — G44021 Chronic cluster headache, intractable: Secondary | ICD-10-CM | POA: Diagnosis present

## 2023-04-17 MED ORDER — GADOBUTROL 1 MMOL/ML IV SOLN
10.0000 mL | Freq: Once | INTRAVENOUS | Status: AC | PRN
  Administered 2023-04-17: 10 mL via INTRAVENOUS

## 2023-04-26 ENCOUNTER — Ambulatory Visit: Payer: Medicare Other | Admitting: Neurology

## 2023-09-08 ENCOUNTER — Other Ambulatory Visit: Payer: Self-pay | Admitting: Surgery

## 2023-09-08 DIAGNOSIS — I7121 Aneurysm of the ascending aorta, without rupture: Secondary | ICD-10-CM

## 2023-10-12 ENCOUNTER — Ambulatory Visit: Payer: Medicare Other | Admitting: Surgery

## 2023-10-17 ENCOUNTER — Other Ambulatory Visit: Payer: Self-pay | Admitting: Cardiology

## 2023-10-24 ENCOUNTER — Encounter (HOSPITAL_COMMUNITY): Payer: Self-pay

## 2023-10-24 ENCOUNTER — Ambulatory Visit (HOSPITAL_COMMUNITY): Payer: Medicare Other

## 2023-10-25 ENCOUNTER — Ambulatory Visit (HOSPITAL_COMMUNITY)
Admission: RE | Admit: 2023-10-25 | Discharge: 2023-10-25 | Disposition: A | Discharge status: Home / Self Care | Payer: Medicare Other | Source: Ambulatory Visit | Attending: Surgery | Admitting: Surgery

## 2023-10-25 DIAGNOSIS — I7121 Aneurysm of the ascending aorta, without rupture: Secondary | ICD-10-CM | POA: Diagnosis present

## 2023-10-25 MED ORDER — SODIUM CHLORIDE (PF) 0.9 % IJ SOLN
INTRAMUSCULAR | Status: AC
  Filled 2023-10-25: qty 50

## 2023-10-25 MED ORDER — IOHEXOL 350 MG/ML SOLN
100.0000 mL | Freq: Once | INTRAVENOUS | Status: AC | PRN
  Administered 2023-10-25: 100 mL via INTRAVENOUS

## 2023-10-26 ENCOUNTER — Ambulatory Visit: Payer: Medicare Other | Admitting: Surgery

## 2023-11-02 ENCOUNTER — Encounter: Payer: Self-pay | Admitting: Surgery

## 2023-11-02 ENCOUNTER — Ambulatory Visit (INDEPENDENT_AMBULATORY_CARE_PROVIDER_SITE_OTHER): Payer: Medicare Other | Admitting: Surgery

## 2023-11-02 VITALS — BP 144/77 | HR 50 | Resp 18 | Ht 68.0 in | Wt 252.0 lb

## 2023-11-02 DIAGNOSIS — I7121 Aneurysm of the ascending aorta, without rupture: Secondary | ICD-10-CM

## 2023-11-02 NOTE — Progress Notes (Signed)
HPI:  The patient is a 72 year old gentleman with a history of hypertension and an ascending aortic aneurysm that was seen on echocardiogram done in [place] in February 2023 that was noted to be 4.3 cm. His aortic valve was trileaflet with trivial regurgitation. He had a cardiac calcium scoring CT on 03/09/2022 that was read as showing a 5.5 cm fusiform ascending aortic aneurysm. Formal CTA of the chest on 03/23/2022 showed an ascending aortic diameter of 4.6 cm. He had a follow-up CTA of the chest on 09/24/2022 which showed the ascending aorta to have a diameter of 48 x 50 mm. He was seen in initial consultation by Dr. Delia Chimes on 10/07/2022 and 65-month follow-up was recommended.  I saw him initially on 04/04/2023 and by my measurement the ascending aortic aneurysm was 4.8 cm.  Radiology measured 5.2 cm in the axial plane and 4.8 cm in the coronal plane.  They did not feel it had significantly changed from the prior scan and 08/2022.  Since I last saw him he has continued to feel well.  He denies any chest or back pain.  Current Outpatient Medications  Medication Sig Dispense Refill   amLODipine (NORVASC) 10 MG tablet Take 10 mg by mouth daily.     CRESTOR 5 MG tablet      esomeprazole (NEXIUM) 20 MG packet Take 20 mg by mouth daily before breakfast.     hydrALAZINE (APRESOLINE) 25 MG tablet Take 25 mg by mouth 3 (three) times daily.     levothyroxine (SYNTHROID) 88 MCG tablet Take 88 mcg by mouth daily before breakfast.     losartan (COZAAR) 100 MG tablet Take 100 mg by mouth daily.     No current facility-administered medications for this visit.     Physical Exam: BP (!) 144/77 (BP Location: Left Arm, Patient Position: Sitting)   Pulse (!) 50   Resp 18   Ht 5\' 8"  (1.727 m)   Wt 252 lb (114.3 kg)   SpO2 95% Comment: RA  BMI 38.32 kg/m  He looks well. Cardiac exam shows a regular rate and rhythm with normal heart sounds.  There is no murmur. Lungs are clear. There is no  peripheral edema.  Diagnostic Tests:  Narrative & Impression  CLINICAL DATA:  Ascending thoracic aortic aneurysm.   EXAM: CT ANGIOGRAPHY CHEST WITH CONTRAST   TECHNIQUE: Multidetector CT imaging of the chest was performed using the standard protocol during bolus administration of intravenous contrast. Multiplanar CT image reconstructions and MIPs were obtained to evaluate the vascular anatomy.   RADIATION DOSE REDUCTION: This exam was performed according to the departmental dose-optimization program which includes automated exposure control, adjustment of the mA and/or kV according to patient size and/or use of iterative reconstruction technique.   CONTRAST:  OMNIPAQUE IOHEXOL 350 MG/ML SOLN   COMPARISON:  March 18, 2023.   FINDINGS: Cardiovascular: Grossly stable 5.2 cm ascending thoracic aortic aneurysm is noted as measured on image number 42 of series 4. No dissection is noted. Great vessels are widely patent. Normal cardiac size. No pericardial effusion.   Mediastinum/Nodes: Small sliding-type hiatal hernia. No adenopathy is noted. Thyroid gland is unremarkable.   Lungs/Pleura: Lungs are clear. No pleural effusion or pneumothorax.   Upper Abdomen: No acute abnormality.   Musculoskeletal: No chest wall abnormality. No acute or significant osseous findings.   Review of the MIP images confirms the above findings.   IMPRESSION: Grossly stable 5.2 cm Ascending thoracic aortic aneurysm. Recommend semi-annual imaging followup  by CTA or MRA and referral to cardiothoracic surgery if not already obtained. This recommendation follows 2010 ACCF/AHA/AATS/ACR/ASA/SCA/SCAI/SIR/STS/SVM Guidelines for the Diagnosis and Management of Patients With Thoracic Aortic Disease. Circulation. 2010; 121: Z610-R604. Aortic aneurysm NOS (ICD10-I71.9).   Small sliding-type hiatal hernia.     Electronically Signed   By: Lupita Raider M.D.   On: 10/28/2023 20:51     Impression:  He has a stable 5.2 cm fusiform ascending aortic aneurysm.  Previous echocardiogram in 2023 had shown a trileaflet aortic valve with mild insufficiency.  His aneurysm is still below the surgical threshold of 5.5 cm.  I reviewed the CTA images with him and his wife and answered their questions.  I stressed the importance of continued good blood pressure control in preventing further enlargement and acute aortic dissection.  I advised him against doing any heavy lifting that may require a Valsalva maneuver and could suddenly raise his blood pressure to high levels.   Plan:  I will see him back in 6 months with a CTA of the chest for aortic surveillance.  I spent 15 minutes performing this established patient evaluation and > 50% of this time was spent face to face counseling and coordinating the care of this patient's aortic aneurysm.    Alleen Borne, MD [facility] [phone]

## 2023-12-01 IMAGING — US US  BREAST BX W/ LOC DEV 1ST LESION IMG BX SPEC US GUIDE*R*
1 series · 12 of 22 positions shown · non-contrast
Comparison: None Available.
COMPARISON: None Available.

Addendum:
CLINICAL DATA: Patient presents for ultrasound-guided core needle
biopsy of 2 right breast masses.

EXAM:
ULTRASOUND GUIDED RIGHT BREAST CORE NEEDLE BIOPSY: 2
ULTRASOUND-GUIDED CORE NEEDLE BIOPSIES PERFORMED.

[Series 1: us breast bx w/ loc dev 1st lesion img bx spec us  · 0.06mm/px · 12 of 22 slices shown]
[im 1/22]
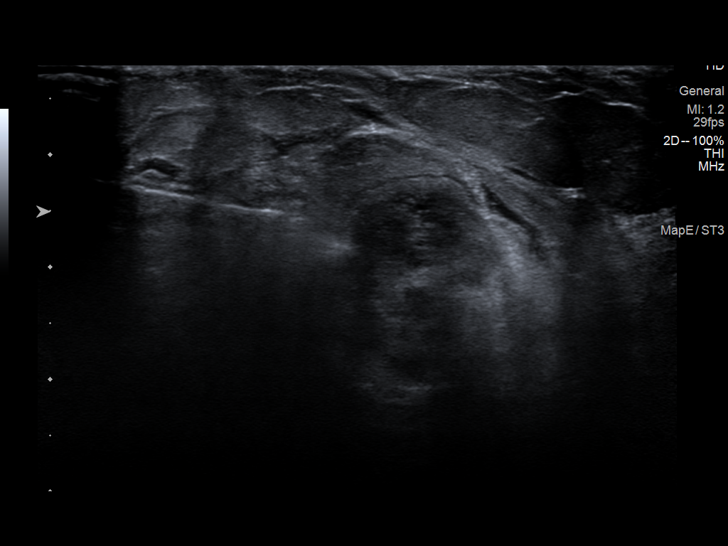
[im 3/22]
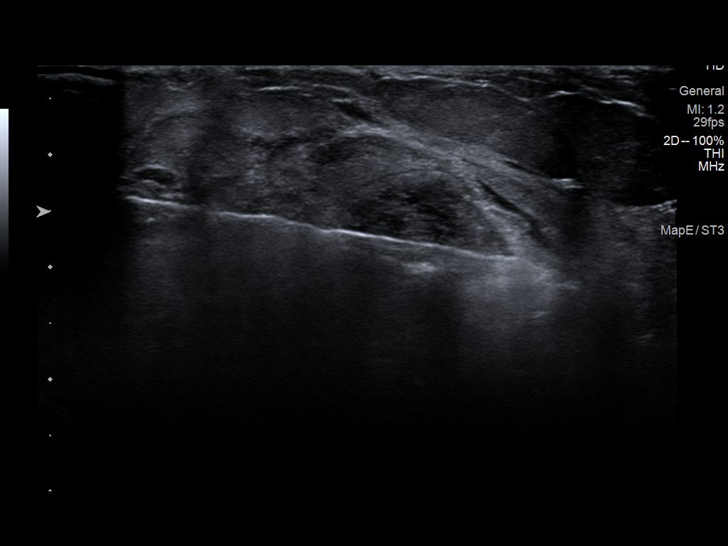
[im 5/22]
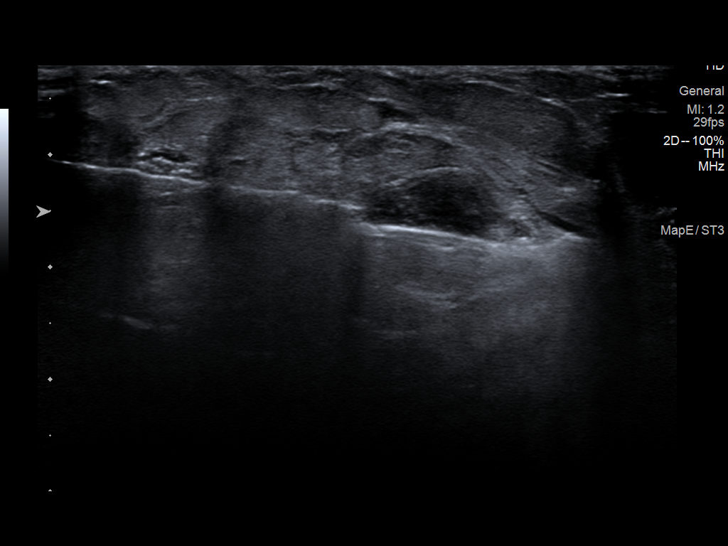
[im 7/22]
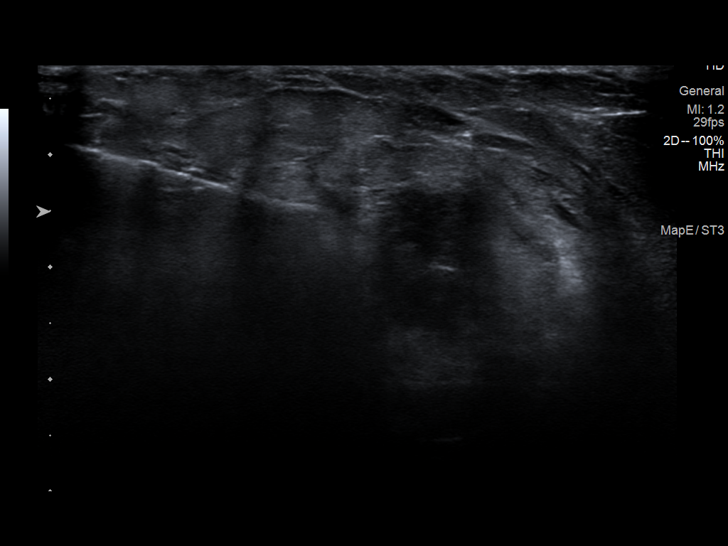
[im 9/22]
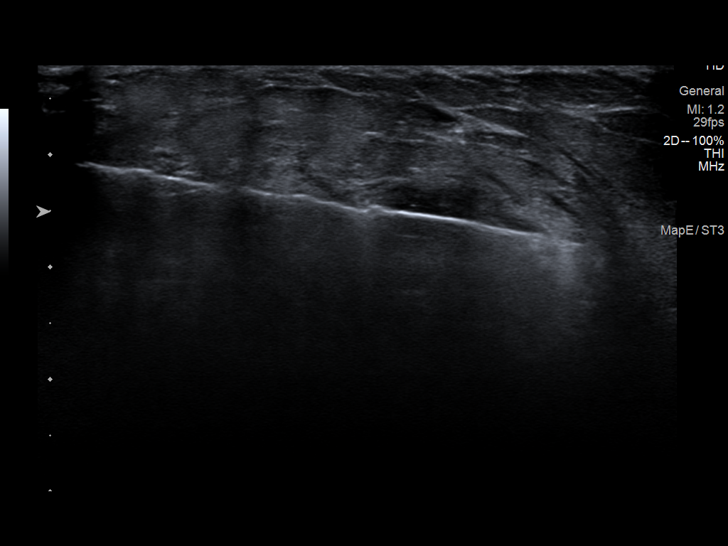
[im 11/22]
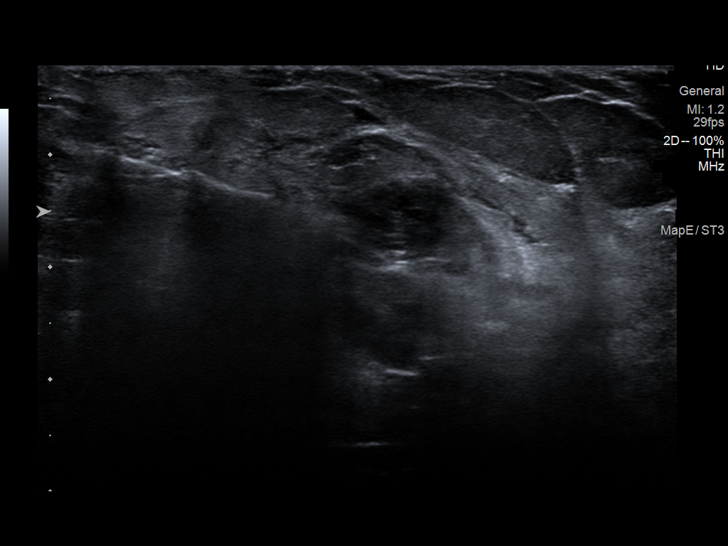
[im 12/22]
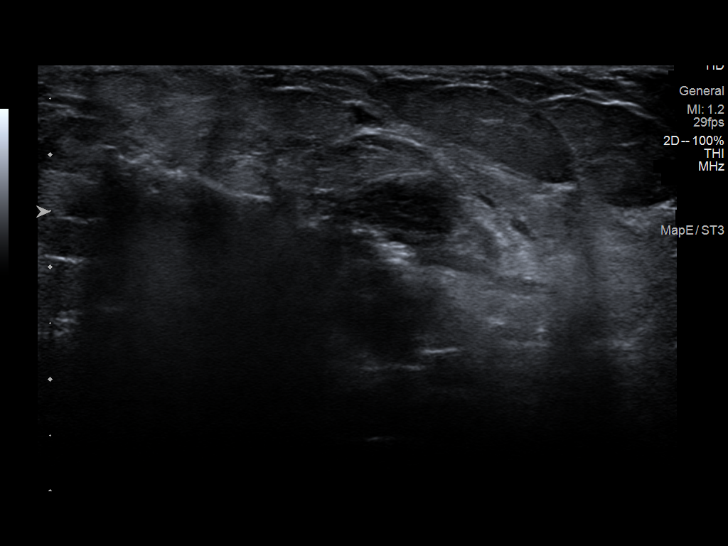
[im 14/22]
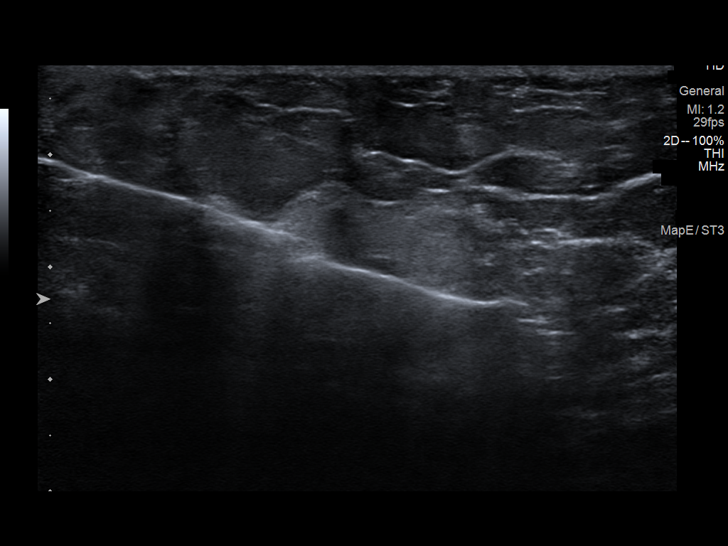
[im 16/22]
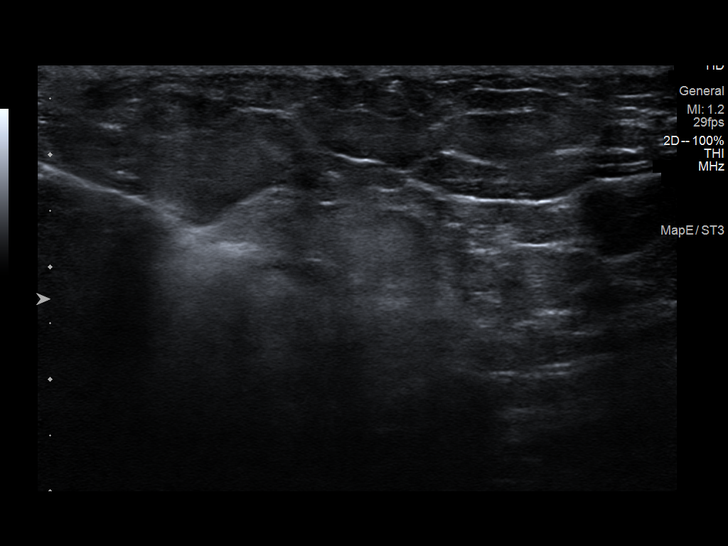
[im 18/22]
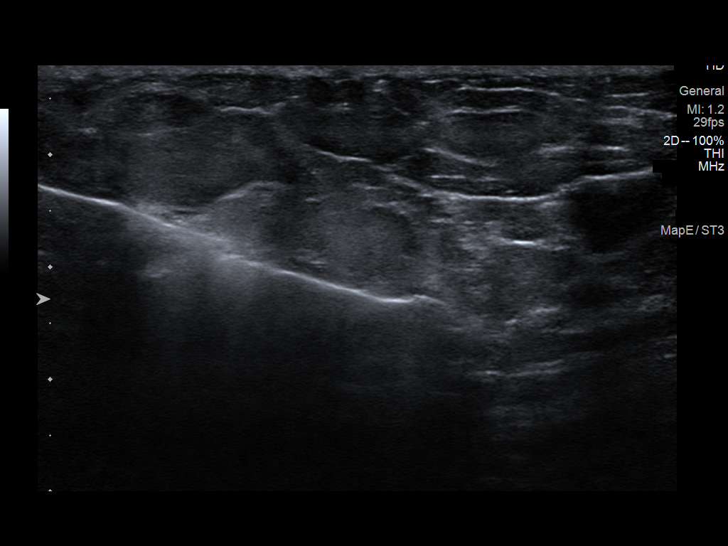
[im 20/22]
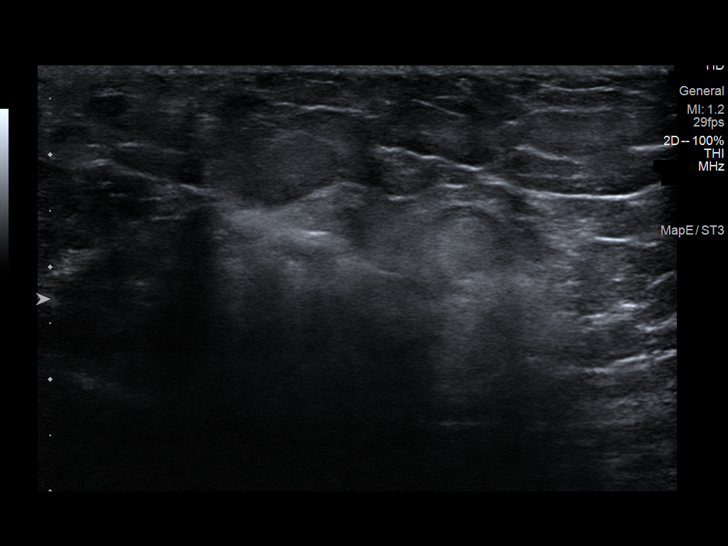
[im 22/22]
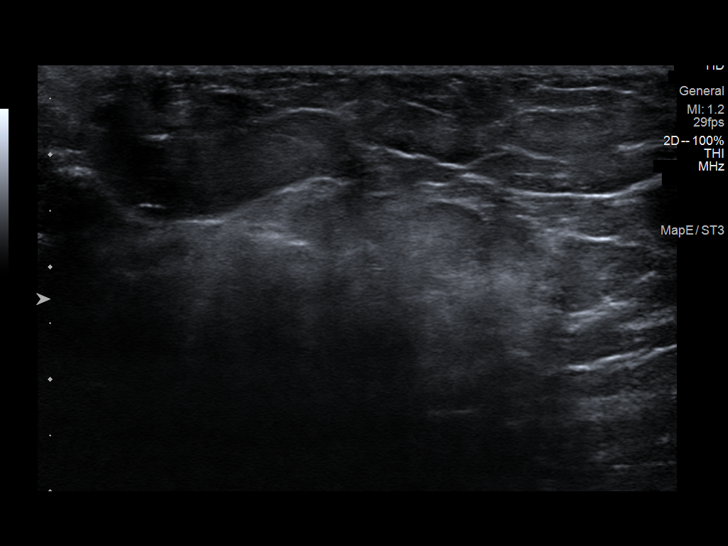

[12 of 22 positions shown; findings below may reference images not displayed]



Biopsy #1: 2.6 cm mass at 12 o'clock, 3 cm from the nipple.

Lesion quadrant: Upper outer quadrant

Using sterile technique and 1% Lidocaine as local anesthetic, under
direct ultrasound visualization, a 12 gauge Rrnata device was
used to perform biopsy of the mass at 12 o'clock using a
inferomedial approach. At the conclusion of the procedure a ribbon
shaped tissue marker clip was deployed into the biopsy cavity.

Biopsy #1: 4 mm mass at 2:30 o'clock, 4 cm from the nipple.

Lesion quadrant: Upper outer quadrant

Using sterile technique and 1% Lidocaine as local anesthetic, under
direct ultrasound visualization, a 14 gauge Rrnata device was
used to perform biopsy of the small or mass at 2:30 o'clock using an
inferior approach. At the conclusion of the procedure a coil shaped
tissue marker clip was deployed into the biopsy cavity.

Follow up 2 view mammogram was performed and dictated separately.
IMPRESSION: Ultrasound guided biopsy of 2 right breast masses. No apparent
complications.

ADDENDUM:
Pathology revealed SPINDLE CELL NEOPLASM MOST CONSISTENT WITH MILD
FIBROBLASTOMA of the RIGHT breast, 12 o'clock, 3 cmfn (ribbon clip).
This was found to be concordant by Dr. Raducanu Godber, with surgical
consultation for excision recommended.

Pathology revealed SPINDLE CELL NEOPLASM MOST CONSISTENT WITH MILD
FIBROBLASTOMA of the RIGHT breast, 2:30 o'clock, 4 cmfn (coil clip).
This was found to be concordant by Dr. Raducanu Godber, with surgical
consultation for excision recommended.

Per patient request, pathology results were discussed today, May 04, 2022 with the patient and wife (Moin) by telephone. The patient
reported doing well after the biopsies with tenderness at the sites.
Post biopsy instructions and care were reviewed and questions were
answered. The patient was encouraged to call The [REDACTED] of

Surgical consultation has been arranged with Dr. Rtoyota Joshjax
at [REDACTED] on May 13, 2022.

Pathology results reported by Reebo Ricardo RN on 05/03/2022.



Biopsy #1: 2.6 cm mass at 12 o'clock, 3 cm from the nipple.

Lesion quadrant: Upper outer quadrant

Using sterile technique and 1% Lidocaine as local anesthetic, under
direct ultrasound visualization, a 12 gauge Rrnata device was
used to perform biopsy of the mass at 12 o'clock using a
inferomedial approach. At the conclusion of the procedure a ribbon
shaped tissue marker clip was deployed into the biopsy cavity.

Biopsy #1: 4 mm mass at 2:30 o'clock, 4 cm from the nipple.

Lesion quadrant: Upper outer quadrant

Using sterile technique and 1% Lidocaine as local anesthetic, under
direct ultrasound visualization, a 14 gauge Rrnata device was
used to perform biopsy of the small or mass at 2:30 o'clock using an
inferior approach. At the conclusion of the procedure a coil shaped
tissue marker clip was deployed into the biopsy cavity.

Follow up 2 view mammogram was performed and dictated separately.
IMPRESSION: Ultrasound guided biopsy of 2 right breast masses. No apparent
complications.

## 2023-12-01 IMAGING — MG MM BREAST LOCALIZATION CLIP
4 series · 4 of 12 positions shown · non-contrast
Comparison: Previous exam(s).

CLINICAL DATA: Assessed post biopsy marker clip placement following

EXAM:
3D DIAGNOSTIC RIGHT MAMMOGRAM POST ULTRASOUND BIOPSY

[R ML synth-2D]
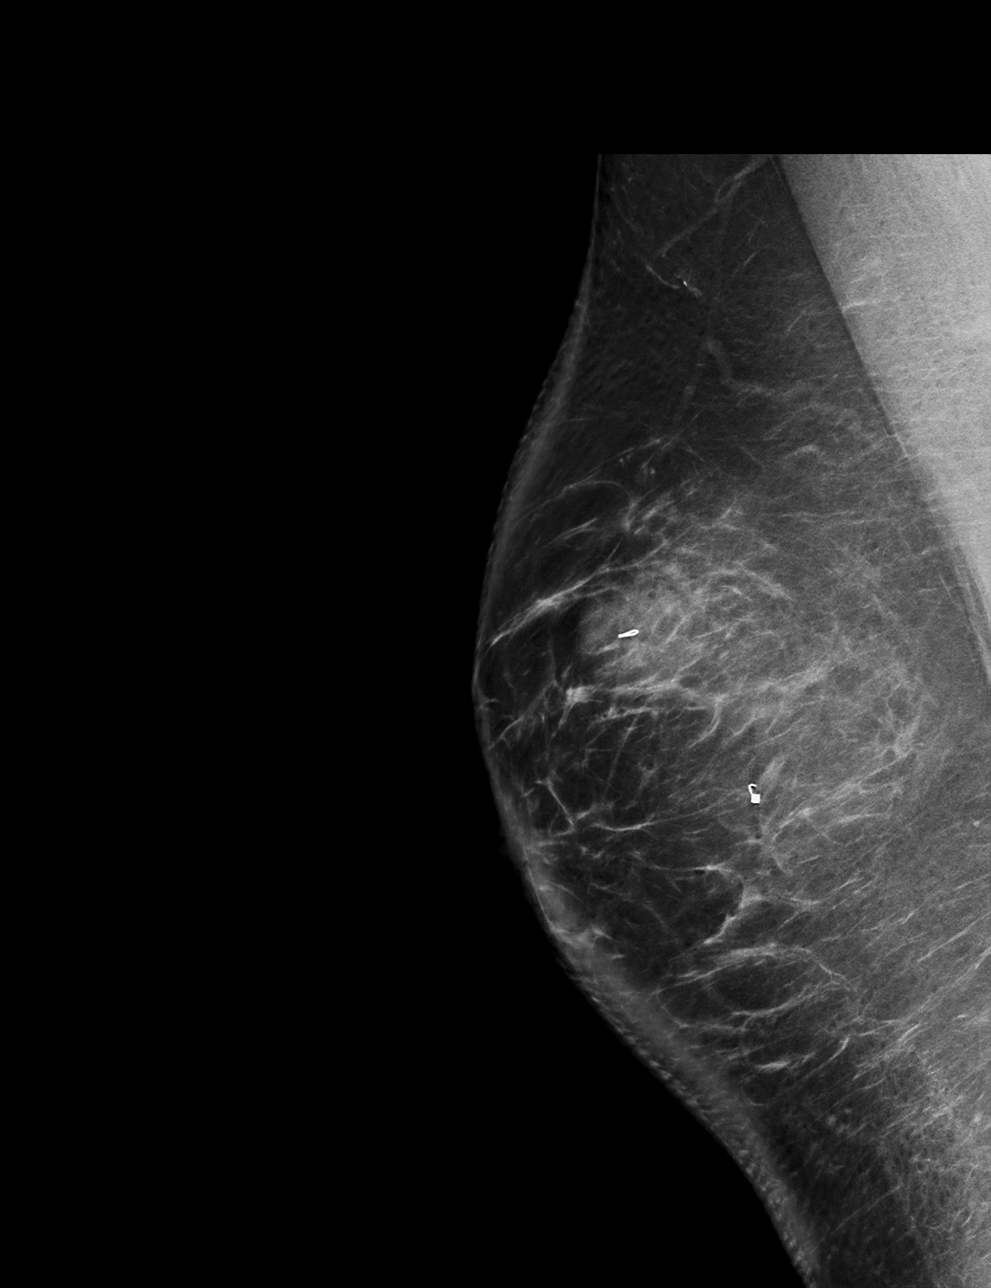

[R CC synth-2D]
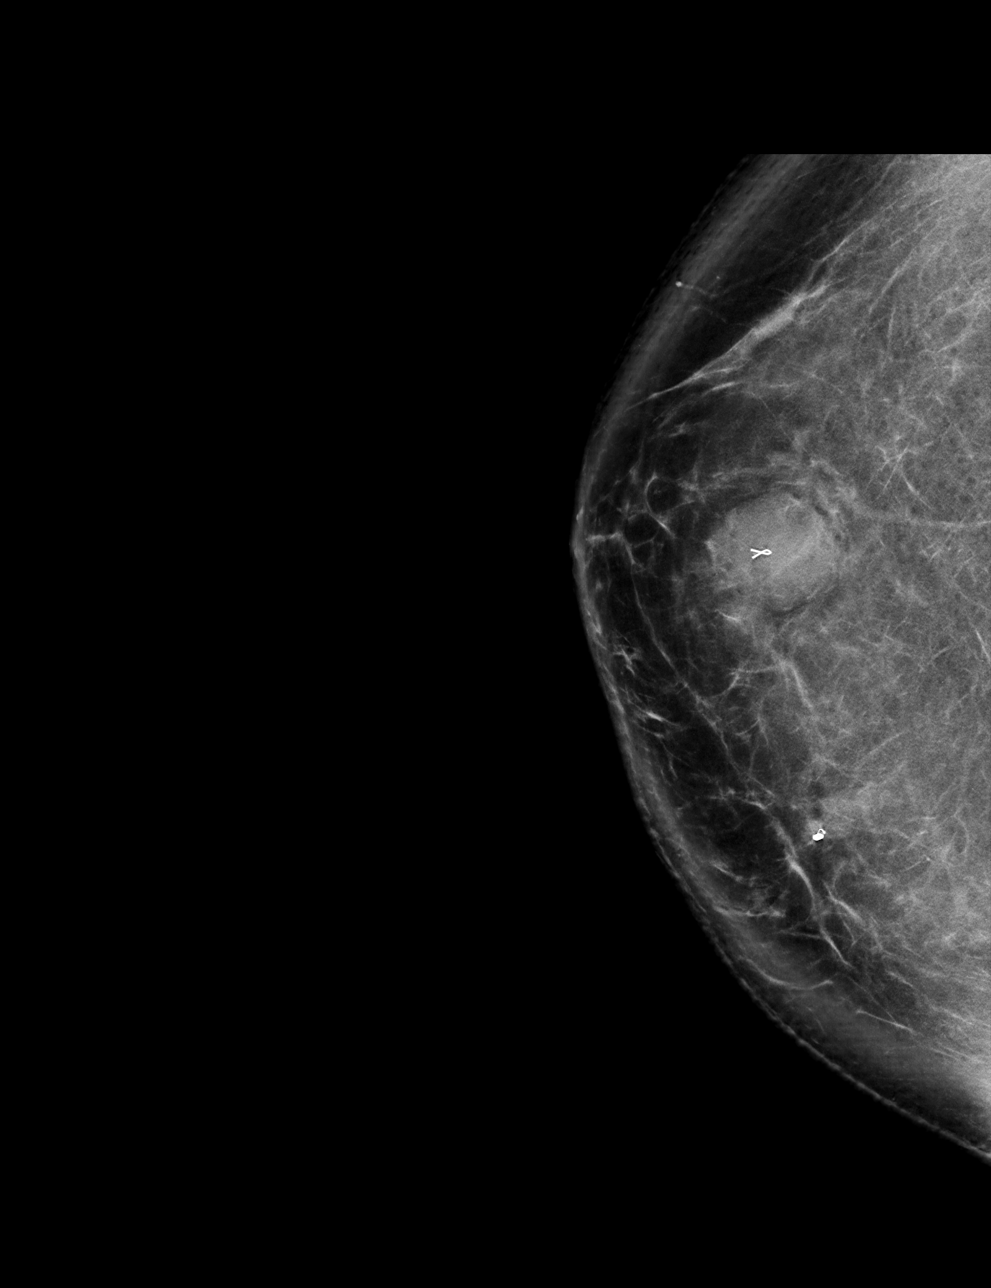

[R CC tomo · tomo slice 57/114.0]
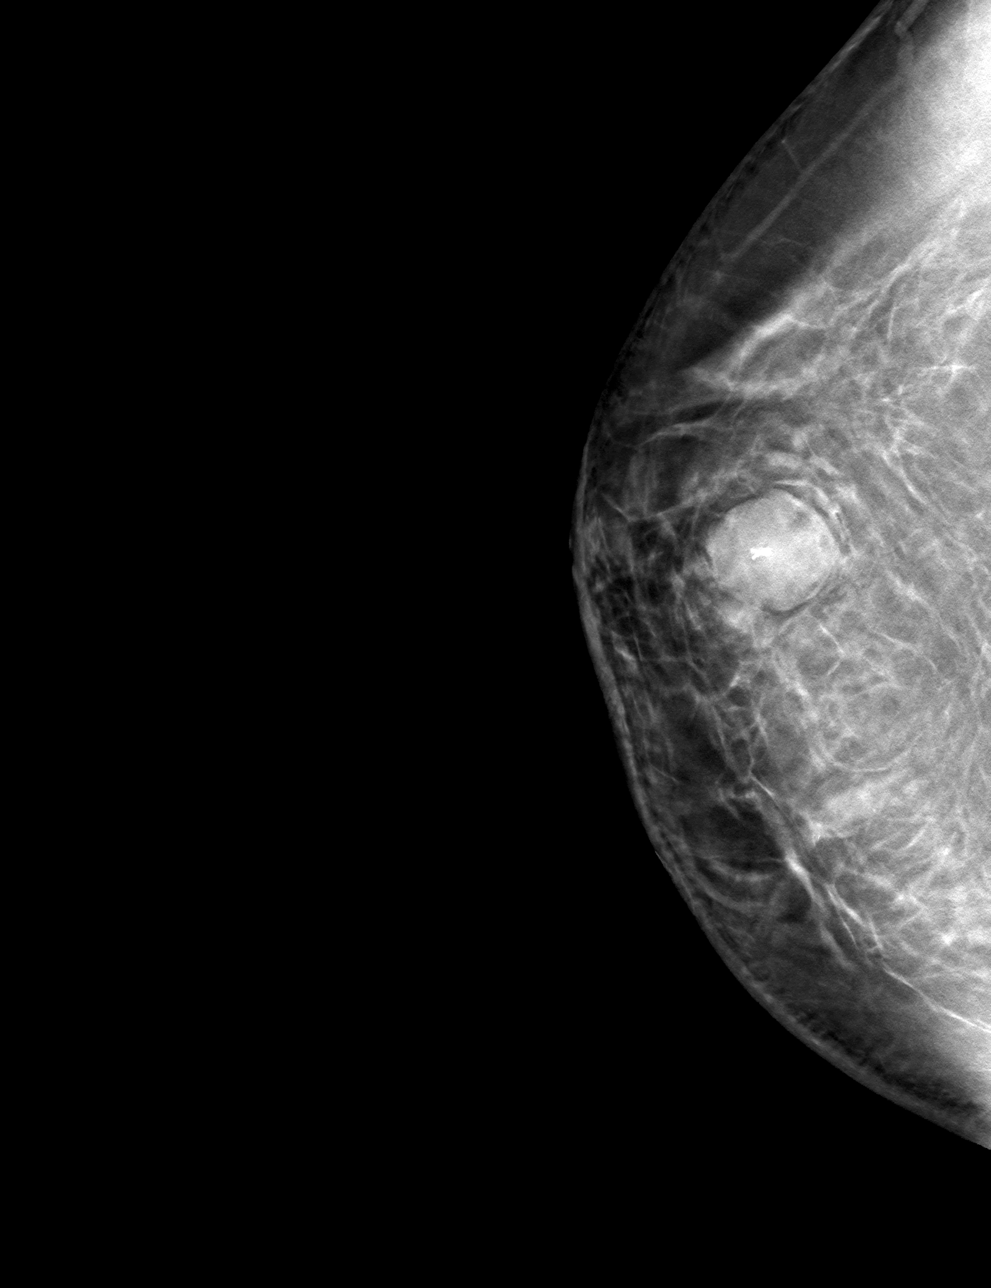

[R ML tomo · tomo slice 61/120.0]
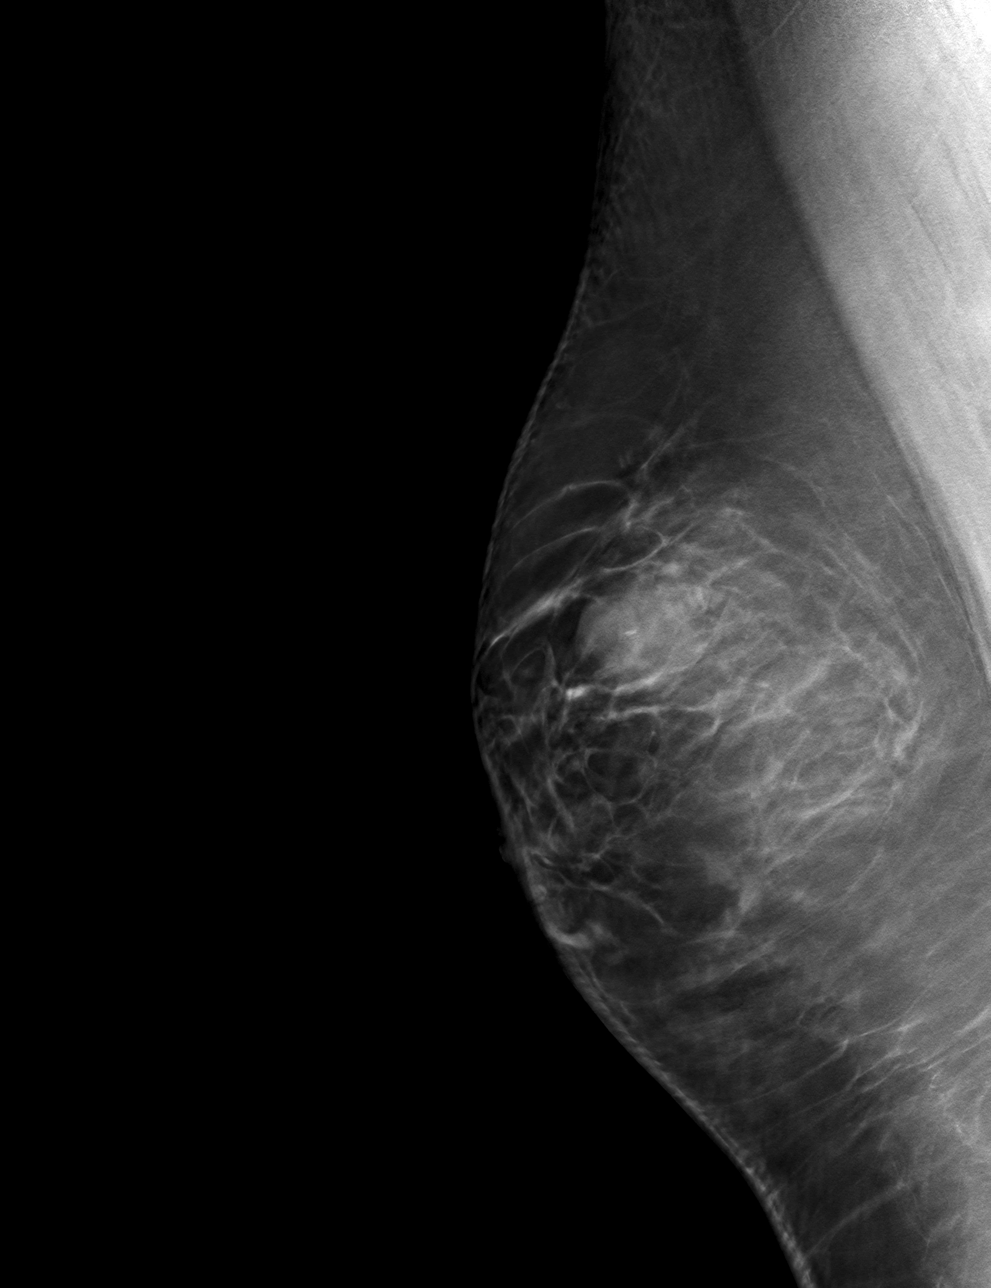

[4 of 12 positions shown; findings below may reference images not displayed]

FINDINGS: 3D Mammographic images were obtained following ultrasound guided
biopsy of the right breast. The ribbon shaped biopsy clip lies
within the dominant 12 o'clock position right breast mass. Coil
shaped clip lies within the smaller mass in the medial right breast.
IMPRESSION: Appropriate positioning of the ribbon and coil shaped biopsy marking
clips at the site of biopsy in the upper and medial right breast
respectively.

Final Assessment: Post Procedure Mammograms for Marker Placement

## 2024-04-02 ENCOUNTER — Other Ambulatory Visit: Payer: Self-pay | Admitting: Surgery

## 2024-04-02 DIAGNOSIS — I7121 Aneurysm of the ascending aorta, without rupture: Secondary | ICD-10-CM

## 2024-04-25 ENCOUNTER — Ambulatory Visit (HOSPITAL_COMMUNITY)
Admission: RE | Admit: 2024-04-25 | Discharge: 2024-04-25 | Disposition: A | Discharge status: Home / Self Care | Source: Ambulatory Visit | Attending: Surgery | Admitting: Surgery

## 2024-04-25 DIAGNOSIS — I7121 Aneurysm of the ascending aorta, without rupture: Secondary | ICD-10-CM | POA: Insufficient documentation

## 2024-04-25 MED ORDER — IOHEXOL 350 MG/ML SOLN
75.0000 mL | Freq: Once | INTRAVENOUS | Status: AC | PRN
  Administered 2024-04-25: 75 mL via INTRAVENOUS

## 2024-05-02 ENCOUNTER — Encounter: Payer: Self-pay | Admitting: Surgery

## 2024-05-02 ENCOUNTER — Ambulatory Visit: Attending: Surgery | Admitting: Surgery

## 2024-05-02 VITALS — BP 140/76 | HR 64 | Resp 20 | Ht 68.0 in | Wt 246.0 lb

## 2024-05-02 DIAGNOSIS — I7121 Aneurysm of the ascending aorta, without rupture: Secondary | ICD-10-CM | POA: Insufficient documentation

## 2024-05-02 NOTE — Progress Notes (Signed)
 [address], [place] [address]             [phone]     HPI:  The patient is a 73 year old gentleman with a history of hypertension and an ascending aortic aneurysm that was seen on echocardiogram done in [place]  in February 2023 that was noted to be 4.3 cm. His aortic valve was trileaflet with trivial regurgitation. He had a cardiac calcium  scoring CT on 03/09/2022 that was read as showing a 5.5 cm fusiform ascending aortic aneurysm. Formal CTA of the chest on 03/23/2022 showed an ascending aortic diameter of 4.6 cm. He had a follow-up CTA of the chest on 09/24/2022 which showed the ascending aorta to have a diameter of 48 x 50 mm. He was seen in initial consultation by Dr. Narciso Backers on 10/07/2022 and 21-month follow-up was recommended.  I saw him initially on 04/04/2023 and by my measurement the ascending aortic aneurysm was 4.8 cm.  Radiology measured 5.2 cm in the axial plane and 4.8 cm in the coronal plane.  They did not feel it had significantly changed from the prior scan and 08/2022.  I last saw him on 11/02/2019 for an CTA of the chest at that time showed the aneurysm to measure 5.2 cm.  He has continued to feel well without chest pain or shortness of breath.  Current Outpatient Medications  Medication Sig Dispense Refill   amLODipine (NORVASC) 10 MG tablet Take 10 mg by mouth daily.     CRESTOR  5 MG tablet      esomeprazole (NEXIUM) 20 MG packet Take 40 mg by mouth daily before breakfast.     hydrALAZINE (APRESOLINE) 25 MG tablet Take 25 mg by mouth 3 (three) times daily.     levothyroxine (SYNTHROID) 88 MCG tablet Take 88 mcg by mouth daily before breakfast.     losartan (COZAAR) 100 MG tablet Take 100 mg by mouth daily.     No current facility-administered medications for this visit.     Physical Exam: BP (!) 140/76   Pulse 64   Resp 20   Ht 5\' 8"  (1.727 m)   Wt 246 lb (111.6 kg)   SpO2 96% Comment: RA  BMI 37.40 kg/m  He looks well. Cardiac  exam shows a regular rate and rhythm with normal heart sounds.  There is no murmur. Lungs are clear. There is no peripheral edema.  Diagnostic Tests:  Narrative & Impression  CLINICAL DATA:  Ascending thoracic aortic aneurysm   EXAM: CT ANGIOGRAPHY CHEST WITH CONTRAST   TECHNIQUE: Multidetector CT imaging of the chest was performed using the standard protocol during bolus administration of intravenous contrast. Multiplanar CT image reconstructions and MIPs were obtained to evaluate the vascular anatomy.   RADIATION DOSE REDUCTION: This exam was performed according to the departmental dose-optimization program which includes automated exposure control, adjustment of the mA and/or kV according to patient size and/or use of iterative reconstruction technique.   CONTRAST:  75mL OMNIPAQUE  IOHEXOL  350 MG/ML SOLN   COMPARISON:  10/25/2023   FINDINGS: Cardiovascular: Stable 5.2 cm ascending thoracic aortic aneurysm, with no evidence of dissection. Bovine configuration of the aortic arch. Great vessels are widely patent.   Borderline cardiomegaly without pericardial effusion. Atherosclerosis within the LAD and circumflex distribution of the coronary vasculature.   Mediastinum/Nodes: No enlarged mediastinal, hilar, or axillary lymph nodes. Thyroid gland, trachea, and esophagus demonstrate no significant findings.   Lungs/Pleura: No acute airspace disease, effusion, or  pneumothorax. Central airways are patent.   Upper Abdomen: No acute abnormality.   Musculoskeletal: No acute or destructive bony abnormalities. Reconstructed images demonstrate no additional findings.   Review of the MIP images confirms the above findings.   IMPRESSION: 1. Stable 5.2 cm ascending thoracic aortic aneurysm, with no evidence of dissection. Recommend semi-annual imaging followup by CTA or MRA and referral to cardiothoracic surgery if not already obtained. This recommendation follows  2010 ACCF/AHA/AATS/ACR/ASA/SCA/SCAI/SIR/STS/SVM Guidelines for the Diagnosis and Management of Patients With Thoracic Aortic Disease. Circulation. 2010; 121: J478-G956. Aortic aneurysm NOS (ICD10-I71.9) 2. Coronary artery atherosclerosis.     Electronically Signed   By: Bobbye Burrow M.D.   On: 04/25/2024 08:43      Impression:  This 73 year old gentleman has a stable 5.2 cm fusiform ascending aortic aneurysm.  Previous echocardiogram has shown a normal trileaflet aortic valve.  His aneurysm is still below the surgical threshold of 5.5 cm.  I reviewed the CT images with him and answered all of his questions.  I stressed the importance of continued good blood pressure control in preventing further enlargement and acute aortic dissection.  I advised him against doing any heavy lifting that may require a Valsalva maneuver and could suddenly raise his blood pressure to high levels.   Plan:  I will see him back in 6 months with a CTA of the chest for surveillance of his ascending aortic aneurysm.  I spent 20 minutes performing this established patient evaluation and > 50% of this time was spent face to face counseling and coordinating the care of this patient's aortic aneurysm.    Bartley Lightning, MD [facility] [phone]

## 2024-10-08 ENCOUNTER — Other Ambulatory Visit: Payer: Self-pay | Admitting: Surgery

## 2024-10-08 DIAGNOSIS — I7121 Aneurysm of the ascending aorta, without rupture: Secondary | ICD-10-CM

## 2024-11-07 ENCOUNTER — Ambulatory Visit: Admitting: Surgery
